# Patient Record
Sex: Female | Born: 1960 | Race: White | Hispanic: No | Marital: Married | Smoking: Never smoker
Health system: Southern US, Community
[De-identification: ages and names within clinical notes are randomized; demographics above are authoritative.]

## PROBLEM LIST (undated history)

## (undated) DIAGNOSIS — B269 Mumps without complication: Secondary | ICD-10-CM

## (undated) DIAGNOSIS — I639 Cerebral infarction, unspecified: Secondary | ICD-10-CM

## (undated) DIAGNOSIS — T7840XA Allergy, unspecified, initial encounter: Secondary | ICD-10-CM

## (undated) DIAGNOSIS — Z0000 Encounter for general adult medical examination without abnormal findings: Secondary | ICD-10-CM

## (undated) DIAGNOSIS — M25562 Pain in left knee: Secondary | ICD-10-CM

## (undated) DIAGNOSIS — R229 Localized swelling, mass and lump, unspecified: Secondary | ICD-10-CM

## (undated) DIAGNOSIS — Z780 Asymptomatic menopausal state: Secondary | ICD-10-CM

## (undated) DIAGNOSIS — B059 Measles without complication: Secondary | ICD-10-CM

## (undated) DIAGNOSIS — B019 Varicella without complication: Secondary | ICD-10-CM

## (undated) DIAGNOSIS — C499 Malignant neoplasm of connective and soft tissue, unspecified: Secondary | ICD-10-CM

## (undated) DIAGNOSIS — G43909 Migraine, unspecified, not intractable, without status migrainosus: Secondary | ICD-10-CM

## (undated) DIAGNOSIS — M5136 Other intervertebral disc degeneration, lumbar region: Secondary | ICD-10-CM

## (undated) HISTORY — DX: Other intervertebral disc degeneration, lumbar region: M51.36

## (undated) HISTORY — DX: Localized swelling, mass and lump, unspecified: R22.9

## (undated) HISTORY — DX: Mumps without complication: B26.9

## (undated) HISTORY — DX: Malignant neoplasm of connective and soft tissue, unspecified: C49.9

## (undated) HISTORY — DX: Pain in left knee: M25.562

## (undated) HISTORY — DX: Encounter for general adult medical examination without abnormal findings: Z00.00

## (undated) HISTORY — DX: Cerebral infarction, unspecified: I63.9

## (undated) HISTORY — DX: Asymptomatic menopausal state: Z78.0

## (undated) HISTORY — DX: Varicella without complication: B01.9

## (undated) HISTORY — DX: Measles without complication: B05.9

## (undated) HISTORY — DX: Allergy, unspecified, initial encounter: T78.40XA

## (undated) HISTORY — DX: Migraine, unspecified, not intractable, without status migrainosus: G43.909

---
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
## 1984-12-04 DIAGNOSIS — I639 Cerebral infarction, unspecified: Secondary | ICD-10-CM

## 1984-12-04 HISTORY — DX: Cerebral infarction, unspecified: I63.9

## 2000-12-04 HISTORY — PX: ABDOMINAL HYSTERECTOMY: SHX81

## 2003-12-05 HISTORY — PX: OTHER SURGICAL HISTORY: SHX169

## 2013-08-18 ENCOUNTER — Telehealth: Payer: Self-pay | Admitting: Family Medicine

## 2013-08-18 NOTE — Telephone Encounter (Signed)
Received medical records from Dr. Renaldo Harrison  [phone]

## 2013-08-22 ENCOUNTER — Ambulatory Visit (INDEPENDENT_AMBULATORY_CARE_PROVIDER_SITE_OTHER): Payer: BC Managed Care – PPO | Admitting: Family Medicine

## 2013-08-22 ENCOUNTER — Encounter: Payer: Self-pay | Admitting: Family Medicine

## 2013-08-22 VITALS — BP 118/72 | HR 65 | Temp 98.8°F | Ht 65.0 in | Wt 143.1 lb

## 2013-08-22 DIAGNOSIS — T7840XA Allergy, unspecified, initial encounter: Secondary | ICD-10-CM

## 2013-08-22 DIAGNOSIS — Z0000 Encounter for general adult medical examination without abnormal findings: Secondary | ICD-10-CM

## 2013-08-22 DIAGNOSIS — M25562 Pain in left knee: Secondary | ICD-10-CM

## 2013-08-22 DIAGNOSIS — Z780 Asymptomatic menopausal state: Secondary | ICD-10-CM

## 2013-08-22 DIAGNOSIS — I639 Cerebral infarction, unspecified: Secondary | ICD-10-CM | POA: Insufficient documentation

## 2013-08-22 DIAGNOSIS — G8929 Other chronic pain: Secondary | ICD-10-CM

## 2013-08-22 DIAGNOSIS — L659 Nonscarring hair loss, unspecified: Secondary | ICD-10-CM

## 2013-08-22 DIAGNOSIS — M25569 Pain in unspecified knee: Secondary | ICD-10-CM

## 2013-08-22 DIAGNOSIS — Z1211 Encounter for screening for malignant neoplasm of colon: Secondary | ICD-10-CM

## 2013-08-22 DIAGNOSIS — G43909 Migraine, unspecified, not intractable, without status migrainosus: Secondary | ICD-10-CM

## 2013-08-22 LAB — RENAL FUNCTION PANEL
CO2: 27 mEq/L (ref 19–32)
Calcium: 9.7 mg/dL (ref 8.4–10.5)
Glucose, Bld: 80 mg/dL (ref 70–99)
Potassium: 4.5 mEq/L (ref 3.5–5.3)
Sodium: 135 mEq/L (ref 135–145)

## 2013-08-22 LAB — LIPID PANEL
HDL: 81 mg/dL (ref >39)
LDL Cholesterol: 114 mg/dL — ABNORMAL HIGH (ref 0–99)
Total CHOL/HDL Ratio: 2.5 Ratio
Triglycerides: 48 mg/dL (ref <150)
VLDL: 10 mg/dL (ref 0–40)

## 2013-08-22 LAB — HEPATIC FUNCTION PANEL
ALT: 12 U/L (ref 0–35)
Bilirubin, Direct: 0.1 mg/dL (ref 0.0–0.3)
Indirect Bilirubin: 0.3 mg/dL (ref 0.0–0.9)
Total Bilirubin: 0.4 mg/dL (ref 0.3–1.2)

## 2013-08-22 LAB — CBC
HCT: 38.7 % (ref 36.0–46.0)
MCV: 88.4 fL (ref 78.0–100.0)
Platelets: 317 10*3/uL (ref 150–400)
RBC: 4.38 MIL/uL (ref 3.87–5.11)
WBC: 6.1 10*3/uL (ref 4.0–10.5)

## 2013-08-22 MED ORDER — FEXOFENADINE HCL 180 MG PO TABS: 180.0000 mg | ORAL_TABLET | Freq: Every day | ORAL | Status: DC

## 2013-08-22 NOTE — Patient Instructions (Addendum)
Mega[employer] caps krill oil daily Digestive [employer]vantage daily  Preventive Care for Adults, Female A healthy lifestyle and preventive care can promote health and wellness. Preventive health guidelines for women include the following key practices.  A routine yearly physical is a good way to check with your caregiver about your health and preventive screening. It is a chance to share any concerns and updates on your health, and to receive a thorough exam.  Visit your dentist for a routine exam and preventive care every 6 months. Brush your teeth twice a day and floss once a day. Good oral hygiene prevents tooth decay and gum disease.  The frequency of eye exams is based on your age, health, family medical history, use of contact lenses, and other factors. Follow your caregiver's recommendations for frequency of eye exams.  Eat a healthy diet. Foods like vegetables, fruits, whole grains, low-fat dairy products, and lean protein foods contain the nutrients you need without too many calories. Decrease your intake of foods high in solid fats, added sugars, and salt. Eat the right amount of calories for you.Get information about a proper diet from your caregiver, if necessary.  Regular physical exercise is one of the most important things you can do for your health. Most adults should get at least 150 minutes of moderate-intensity exercise (any activity that increases your heart rate and causes you to sweat) each week. In addition, most adults need muscle-strengthening exercises on 2 or more days a week.  Maintain a healthy weight. The body mass index (BMI) is a screening tool to identify possible weight problems. It provides an estimate of body fat based on height and weight. Your caregiver can help determine your BMI, and can help you achieve or maintain a healthy weight.For adults 20 years and older:  A BMI below 18.5 is considered underweight.  A BMI of 18.5 to 24.9 is normal.  A BMI of 25 to 29.9 is  considered overweight.  A BMI of 30 and above is considered obese.  Maintain normal blood lipids and cholesterol levels by exercising and minimizing your intake of saturated fat. Eat a balanced diet with plenty of fruit and vegetables. Blood tests for lipids and cholesterol should begin at age 62 and be repeated every 5 years. If your lipid or cholesterol levels are high, you are over 50, or you are at high risk for heart disease, you may need your cholesterol levels checked more frequently.Ongoing high lipid and cholesterol levels should be treated with medicines if diet and exercise are not effective.  If you smoke, find out from your caregiver how to quit. If you do not use tobacco, do not start.  If you are pregnant, do not drink alcohol. If you are breastfeeding, be very cautious about drinking alcohol. If you are not pregnant and choose to drink alcohol, do not exceed 1 drink per day. One drink is considered to be 12 ounces (355 mL) of beer, 5 ounces (148 mL) of wine, or 1.5 ounces (44 mL) of liquor.  Avoid use of street drugs. Do not share needles with anyone. Ask for help if you need support or instructions about stopping the use of drugs.  High blood pressure causes heart disease and increases the risk of stroke. Your blood pressure should be checked at least every 1 to 2 years. Ongoing high blood pressure should be treated with medicines if weight loss and exercise are not effective.  If you are 39 to 52 years old, ask your caregiver if  you should take aspirin to prevent strokes.  Diabetes screening involves taking a blood sample to check your fasting blood sugar level. This should be done once every 3 years, after age 57, if you are within normal weight and without risk factors for diabetes. Testing should be considered at a younger age or be carried out more frequently if you are overweight and have at least 1 risk factor for diabetes.  Breast cancer screening is essential preventive  care for women. You should practice "breast self-awareness." This means understanding the normal appearance and feel of your breasts and may include breast self-examination. Any changes detected, no matter how small, should be reported to a caregiver. Women in their 54s and 30s should have a clinical breast exam (CBE) by a caregiver as part of a regular health exam every 1 to 3 years. After age 13, women should have a CBE every year. Starting at age 72, women should consider having a mammography (breast X-ray test) every year. Women who have a family history of breast cancer should talk to their caregiver about genetic screening. Women at a high risk of breast cancer should talk to their caregivers about having magnetic resonance imaging (MRI) and a mammography every year.  The Pap test is a screening test for cervical cancer. A Pap test can show cell changes on the cervix that might become cervical cancer if left untreated. A Pap test is a procedure in which cells are obtained and examined from the lower end of the uterus (cervix).  Women should have a Pap test starting at age 65.  Between ages 64 and 48, Pap tests should be repeated every 2 years.  Beginning at age 30, you should have a Pap test every 3 years as long as the past 3 Pap tests have been normal.  Some women have medical problems that increase the chance of getting cervical cancer. Talk to your caregiver about these problems. It is especially important to talk to your caregiver if a new problem develops soon after your last Pap test. In these cases, your caregiver may recommend more frequent screening and Pap tests.  The above recommendations are the same for women who have or have not gotten the vaccine for human papillomavirus (HPV).  If you had a hysterectomy for a problem that was not cancer or a condition that could lead to cancer, then you no longer need Pap tests. Even if you no longer need a Pap test, a regular exam is a good idea  to make sure no other problems are starting.  If you are between ages 83 and 29, and you have had normal Pap tests going back 10 years, you no longer need Pap tests. Even if you no longer need a Pap test, a regular exam is a good idea to make sure no other problems are starting.  If you have had past treatment for cervical cancer or a condition that could lead to cancer, you need Pap tests and screening for cancer for at least 20 years after your treatment.  If Pap tests have been discontinued, risk factors (such as a new sexual partner) need to be reassessed to determine if screening should be resumed.  The HPV test is an additional test that may be used for cervical cancer screening. The HPV test looks for the virus that can cause the cell changes on the cervix. The cells collected during the Pap test can be tested for HPV. The HPV test could be used to  screen women aged 56 years and older, and should be used in women of any age who have unclear Pap test results. After the age of 70, women should have HPV testing at the same frequency as a Pap test.  Colorectal cancer can be detected and often prevented. Most routine colorectal cancer screening begins at the age of 99 and continues through age 75. However, your caregiver may recommend screening at an earlier age if you have risk factors for colon cancer. On a yearly basis, your caregiver may provide home test kits to check for hidden blood in the stool. Use of a small camera at the end of a tube, to directly examine the colon (sigmoidoscopy or colonoscopy), can detect the earliest forms of colorectal cancer. Talk to your caregiver about this at age 15, when routine screening begins. Direct examination of the colon should be repeated every 5 to 10 years through age 54, unless early forms of pre-cancerous polyps or small growths are found.  Hepatitis C blood testing is recommended for all people born from [birth year] through 1965 and any individual with known  risks for hepatitis C.  Practice safe sex. Use condoms and avoid high-risk sexual practices to reduce the spread of sexually transmitted infections (STIs). STIs include gonorrhea, chlamydia, syphilis, trichomonas, herpes, HPV, and human immunodeficiency virus (HIV). Herpes, HIV, and HPV are viral illnesses that have no cure. They can result in disability, cancer, and death. Sexually active women aged 59 and younger should be checked for chlamydia. Older women with new or multiple partners should also be tested for chlamydia. Testing for other STIs is recommended if you are sexually active and at increased risk.  Osteoporosis is a disease in which the bones lose minerals and strength with aging. This can result in serious bone fractures. The risk of osteoporosis can be identified using a bone density scan. Women ages 36 and over and women at risk for fractures or osteoporosis should discuss screening with their caregivers. Ask your caregiver whether you should take a calcium supplement or vitamin D to reduce the rate of osteoporosis.  Menopause can be associated with physical symptoms and risks. Hormone replacement therapy is available to decrease symptoms and risks. You should talk to your caregiver about whether hormone replacement therapy is right for you.  Use sunscreen with sun protection factor (SPF) of 30 or more. Apply sunscreen liberally and repeatedly throughout the day. You should seek shade when your shadow is shorter than you. Protect yourself by wearing long sleeves, pants, a wide-brimmed hat, and sunglasses year round, whenever you are outdoors.  Once a month, do a whole body skin exam, using a mirror to look at the skin on your back. Notify your caregiver of new moles, moles that have irregular borders, moles that are larger than a pencil eraser, or moles that have changed in shape or color.  Stay current with required immunizations.  Influenza. You need a dose every fall (or winter).  The composition of the flu vaccine changes each year, so being vaccinated once is not enough.  Pneumococcal polysaccharide. You need 1 to 2 doses if you smoke cigarettes or if you have certain chronic medical conditions. You need 1 dose at age 29 (or older) if you have never been vaccinated.  Tetanus, diphtheria, pertussis (Tdap, Td). Get 1 dose of Tdap vaccine if you are younger than age 28, are over 18 and have contact with an infant, are a Research scientist (physical sciences), are pregnant, or simply want to be protected  from whooping cough. After that, you need a Td booster dose every 10 years. Consult your caregiver if you have not had at least 3 tetanus and diphtheria-containing shots sometime in your life or have a deep or dirty wound.  HPV. You need this vaccine if you are a woman age 52 or younger. The vaccine is given in 3 doses over 6 months.  Measles, mumps, rubella (MMR). You need at least 1 dose of MMR if you were born in 1957 or later. You may also need a second dose.  Meningococcal. If you are age 70 to 2 and a first-year college student living in a residence hall, or have one of several medical conditions, you need to get vaccinated against meningococcal disease. You may also need additional booster doses.  Zoster (shingles). If you are age 81 or older, you should get this vaccine.  Varicella (chickenpox). If you have never had chickenpox or you were vaccinated but received only 1 dose, talk to your caregiver to find out if you need this vaccine.  Hepatitis A. You need this vaccine if you have a specific risk factor for hepatitis A virus infection or you simply wish to be protected from this disease. The vaccine is usually given as 2 doses, 6 to 18 months apart.  Hepatitis B. You need this vaccine if you have a specific risk factor for hepatitis B virus infection or you simply wish to be protected from this disease. The vaccine is given in 3 doses, usually over 6 months. Preventive Services /  Frequency Ages 42 to 67  Blood pressure check.** / Every 1 to 2 years.  Lipid and cholesterol check.** / Every 5 years beginning at age 44.  Clinical breast exam.** / Every 3 years for women in their 71s and 30s.  Pap test.** / Every 2 years from ages 34 through 40. Every 3 years starting at age 2 through age 25 or 55 with a history of 3 consecutive normal Pap tests.  HPV screening.** / Every 3 years from ages 14 through ages 39 to 35 with a history of 3 consecutive normal Pap tests.  Hepatitis C blood test.** / For any individual with known risks for hepatitis C.  Skin self-exam. / Monthly.  Influenza immunization.** / Every year.  Pneumococcal polysaccharide immunization.** / 1 to 2 doses if you smoke cigarettes or if you have certain chronic medical conditions.  Tetanus, diphtheria, pertussis (Tdap, Td) immunization. / A one-time dose of Tdap vaccine. After that, you need a Td booster dose every 10 years.  HPV immunization. / 3 doses over 6 months, if you are 70 and younger.  Measles, mumps, rubella (MMR) immunization. / You need at least 1 dose of MMR if you were born in 1957 or later. You may also need a second dose.  Meningococcal immunization. / 1 dose if you are age 27 to 60 and a first-year college student living in a residence hall, or have one of several medical conditions, you need to get vaccinated against meningococcal disease. You may also need additional booster doses.  Varicella immunization.** / Consult your caregiver.  Hepatitis A immunization.** / Consult your caregiver. 2 doses, 6 to 18 months apart.  Hepatitis B immunization.** / Consult your caregiver. 3 doses usually over 6 months. Ages 90 to 73  Blood pressure check.** / Every 1 to 2 years.  Lipid and cholesterol check.** / Every 5 years beginning at age 40.  Clinical breast exam.** / Every year after age 45.  Mammogram.** / Every year beginning at age 80 and continuing for as long as you are in  good health. Consult with your caregiver.  Pap test.** / Every 3 years starting at age 79 through age 28 or 5 with a history of 3 consecutive normal Pap tests.  HPV screening.** / Every 3 years from ages 9 through ages 33 to 61 with a history of 3 consecutive normal Pap tests.  Fecal occult blood test (FOBT) of stool. / Every year beginning at age 24 and continuing until age 61. You may not need to do this test if you get a colonoscopy every 10 years.  Flexible sigmoidoscopy or colonoscopy.** / Every 5 years for a flexible sigmoidoscopy or every 10 years for a colonoscopy beginning at age 36 and continuing until age 90.  Hepatitis C blood test.** / For all people born from [birth year] through 1965 and any individual with known risks for hepatitis C.  Skin self-exam. / Monthly.  Influenza immunization.** / Every year.  Pneumococcal polysaccharide immunization.** / 1 to 2 doses if you smoke cigarettes or if you have certain chronic medical conditions.  Tetanus, diphtheria, pertussis (Tdap, Td) immunization.** / A one-time dose of Tdap vaccine. After that, you need a Td booster dose every 10 years.  Measles, mumps, rubella (MMR) immunization. / You need at least 1 dose of MMR if you were born in 1957 or later. You may also need a second dose.  Varicella immunization.** / Consult your caregiver.  Meningococcal immunization.** / Consult your caregiver.  Hepatitis A immunization.** / Consult your caregiver. 2 doses, 6 to 18 months apart.  Hepatitis B immunization.** / Consult your caregiver. 3 doses, usually over 6 months. Ages 19 and over  Blood pressure check.** / Every 1 to 2 years.  Lipid and cholesterol check.** / Every 5 years beginning at age 66.  Clinical breast exam.** / Every year after age 53.  Mammogram.** / Every year beginning at age 28 and continuing for as long as you are in good health. Consult with your caregiver.  Pap test.** / Every 3 years starting at age 18 through  age 12 or 18 with a 3 consecutive normal Pap tests. Testing can be stopped between 65 and 70 with 3 consecutive normal Pap tests and no abnormal Pap or HPV tests in the past 10 years.  HPV screening.** / Every 3 years from ages 51 through ages 38 or 39 with a history of 3 consecutive normal Pap tests. Testing can be stopped between 65 and 70 with 3 consecutive normal Pap tests and no abnormal Pap or HPV tests in the past 10 years.  Fecal occult blood test (FOBT) of stool. / Every year beginning at age 64 and continuing until age 43. You may not need to do this test if you get a colonoscopy every 10 years.  Flexible sigmoidoscopy or colonoscopy.** / Every 5 years for a flexible sigmoidoscopy or every 10 years for a colonoscopy beginning at age 55 and continuing until age 62.  Hepatitis C blood test.** / For all people born from [birth year] through 1965 and any individual with known risks for hepatitis C.  Osteoporosis screening.** / A one-time screening for women ages 60 and over and women at risk for fractures or osteoporosis.  Skin self-exam. / Monthly.  Influenza immunization.** / Every year.  Pneumococcal polysaccharide immunization.** / 1 dose at age 25 (or older) if you have never been vaccinated.  Tetanus, diphtheria, pertussis (Tdap, Td) immunization. / A one-time  dose of Tdap vaccine if you are over 65 and have contact with an infant, are a Research scientist (physical sciences), or simply want to be protected from whooping cough. After that, you need a Td booster dose every 10 years.  Varicella immunization.** / Consult your caregiver.  Meningococcal immunization.** / Consult your caregiver.  Hepatitis A immunization.** / Consult your caregiver. 2 doses, 6 to 18 months apart.  Hepatitis B immunization.** / Check with your caregiver. 3 doses, usually over 6 months. ** Family history and personal history of risk and conditions may change your caregiver's recommendations. Document Released: 01/16/2002 Document  Revised: 02/12/2012 Document Reviewed: 04/17/2011 [facility] Patient Information 2014 [place], [place].

## 2013-08-24 ENCOUNTER — Encounter: Payer: Self-pay | Admitting: Family Medicine

## 2013-08-24 DIAGNOSIS — M25562 Pain in left knee: Secondary | ICD-10-CM

## 2013-08-24 DIAGNOSIS — Z780 Asymptomatic menopausal state: Secondary | ICD-10-CM

## 2013-08-24 DIAGNOSIS — Z0000 Encounter for general adult medical examination without abnormal findings: Secondary | ICD-10-CM

## 2013-08-24 HISTORY — DX: Pain in left knee: M25.562

## 2013-08-24 HISTORY — DX: Asymptomatic menopausal state: Z78.0

## 2013-08-24 HISTORY — DX: Encounter for general adult medical examination without abnormal findings: Z00.00

## 2013-08-24 NOTE — Assessment & Plan Note (Signed)
Good response to Allegra, continue the same.

## 2013-08-24 NOTE — Assessment & Plan Note (Addendum)
Bone densitometry ordered today. Will proceed with 3D MGM at same time

## 2013-08-24 NOTE — Assessment & Plan Note (Signed)
Has had trouble with this knee for years but it is worsening and becoming more swelling. Is referred to orthopaedics for further consideration at this time.

## 2013-08-24 NOTE — Progress Notes (Signed)
Patient ID: Krista Hampton, female   DOB: 14-Mar-1961, 52 y.o.   MRN: [id number] Krista Hampton [id number] Dec 08, 1960 08/24/2013      Progress Note New Patient  Subjective  Chief Complaint  Chief Complaint  Patient presents with  . Establish Care    new patient    HPI  Patient is a 52 year old Caucasian female who is recently relocated to the area. His immediate ongoing primary care. Feels well today. No recent illness. Her largest complaint is of left knee pain. She's had swelling and discomfort in her lateral left knee for years but is worsening recently. Denies any trauma, redness or warmth. He is struggling with numerous low grade concern such as poor sleep, awakened easily. Also notes anxiety with the move and some recent hair loss. He is questioning whether she could have some thyroid dysfunction as her sister has recently been diagnosed with thyroid disease. She has set herself up with a GYN appointment but it is not October. No recent chest pain, fevers, palpitations, shortness of breath, GI or GU complaints.  Past Medical History  Diagnosis Date  . 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 Chicken pox as a child  . Measles as a child  . Mumps as a child  . Allergy     hay fever  . Migraines   . Post-menopausal 08/24/2013  . Preventative health care 08/24/2013  . Left knee pain 08/24/2013  . Allergic state     Past Surgical History  Procedure Laterality Date  . Abdominal hysterectomy  2002    partial- still has left ovary  . Right ovary  2005    removed  . Tonsillectomy  1979  . Wisdom tooth extraction  52 yrs old    Family History  Problem Relation Age of Onset  . Lupus Mother   . Diabetes Mother     type 2  . Hypertension Mother   . Heart disease Father     open heart surgery  . Hyperlipidemia Father   . Heart attack Father     several  . Diabetes Sister     type 2  . Heart disease Sister   . Diabetes Sister     type 2  . Hypertension Sister   . Thyroid  disease Sister   . Kidney disease Sister   . Heart disease Brother   . Hypertension Brother   . Diabetes Maternal Grandmother     type 2  . Heart attack Paternal Grandfather 36    massive    History   Social History  . Marital Status: Married    Spouse Name: N/A    Number of Children: N/A  . Years of Education: N/A   Occupational History  . Not on file.   Social History Main Topics  . Smoking status: Never Smoker   . Smokeless tobacco: Not on file  . Alcohol Use: Yes     Comment: occasional drik  . Drug Use: No  . Sexual Activity: Yes   Other Topics Concern  . Not on file   Social History Narrative  . No narrative on file    No current outpatient prescriptions on file prior to visit.   No current facility-administered medications on file prior to visit.    Allergies  Allergen Reactions  . Latex   . Sulfa Antibiotics Hives  . Keflex [Cephalexin] Rash  . Penicillins Rash  . Tetracyclines & Related Hives and Rash    Review of Systems  Review of Systems  Constitutional: Negative for fever, chills and malaise/fatigue.  HENT: Negative for hearing loss, nosebleeds and congestion.   Eyes: Negative for discharge.  Respiratory: Negative for cough, sputum production, shortness of breath and wheezing.   Cardiovascular: Negative for chest pain, palpitations and leg swelling.  Gastrointestinal: Negative for heartburn, nausea, vomiting, abdominal pain, diarrhea, constipation and blood in stool.  Genitourinary: Negative for dysuria, urgency, frequency and hematuria.  Musculoskeletal: Positive for joint pain. Negative for myalgias, back pain and falls.  Skin: Negative for rash.  Neurological: Negative for dizziness, tremors, sensory change, focal weakness, loss of consciousness, weakness and headaches.  Endo/Heme/Allergies: Negative for polydipsia. Does not bruise/bleed easily.  Psychiatric/Behavioral: Negative for depression and suicidal ideas. The patient is not  nervous/anxious and does not have insomnia.     Objective  BP 118/72  Pulse 65  Temp(Src) 98.8 F (37.1 C) (Oral)  Ht 5\' 5"  (1.651 m)  Wt 143 lb 1.9 oz (64.919 kg)  BMI 23.82 kg/m2  SpO2 97%  Physical Exam  Physical Exam  Constitutional: She is oriented to person, place, and time and well-developed, well-nourished, and in no distress. No distress.  HENT:  Head: Normocephalic and atraumatic.  Right Ear: External ear normal.  Left Ear: External ear normal.  Nose: Nose normal.  Mouth/Throat: Oropharynx is clear and moist. No oropharyngeal exudate.  Eyes: Conjunctivae are normal. Pupils are equal, round, and reactive to light. Right eye exhibits no discharge. Left eye exhibits no discharge. No scleral icterus.  Neck: Normal range of motion. Neck supple. No thyromegaly present.  Cardiovascular: Normal rate, regular rhythm, normal heart sounds and intact distal pulses.   No murmur heard. Pulmonary/Chest: Effort normal and breath sounds normal. No respiratory distress. She has no wheezes. She has no rales.  Abdominal: Soft. Bowel sounds are normal. She exhibits no distension and no mass. There is no tenderness.  Musculoskeletal: Normal range of motion. She exhibits edema. She exhibits no tenderness.  Swelling caudal and lateral to left knee.   Lymphadenopathy:    She has no cervical adenopathy.  Neurological: She is alert and oriented to person, place, and time. She has normal reflexes. No cranial nerve deficit. Coordination normal.  Skin: Skin is warm and dry. No rash noted. She is not diaphoretic.  Psychiatric: Mood, memory and affect normal.       Assessment & Plan  Left knee pain Has had trouble with this knee for years but it is worsening and becoming more swelling. Is referred to orthopaedics for further consideration at this time.  Allergic state Good response to Allegra, continue the same.   Post-menopausal Bone densitometry ordered today.   Migraines No recent  trouble, had them prior to her hysterectomy  Preventative health care Declines flu shot today, fasting labs today, encouraged heart healthy diet, probiotics daily, regular sleep. Continue exercise.

## 2013-08-24 NOTE — Assessment & Plan Note (Signed)
No recent trouble, had them prior to her hysterectomy

## 2013-08-24 NOTE — Assessment & Plan Note (Signed)
Declines flu shot today, fasting labs today, encouraged heart healthy diet, probiotics daily, regular sleep. Continue exercise.

## 2013-08-25 ENCOUNTER — Other Ambulatory Visit: Payer: Self-pay | Admitting: Family Medicine

## 2013-08-25 DIAGNOSIS — Z1231 Encounter for screening mammogram for malignant neoplasm of breast: Secondary | ICD-10-CM

## 2013-09-15 ENCOUNTER — Encounter: Payer: Self-pay | Admitting: Gastroenterology

## 2013-09-30 ENCOUNTER — Ambulatory Visit
Admission: RE | Admit: 2013-09-30 | Discharge: 2013-09-30 | Disposition: A | Discharge status: Home / Self Care | Payer: BC Managed Care – PPO | Source: Ambulatory Visit | Attending: Family Medicine | Admitting: Family Medicine

## 2013-09-30 DIAGNOSIS — Z780 Asymptomatic menopausal state: Secondary | ICD-10-CM

## 2013-09-30 DIAGNOSIS — Z1231 Encounter for screening mammogram for malignant neoplasm of breast: Secondary | ICD-10-CM

## 2013-10-07 ENCOUNTER — Ambulatory Visit: Payer: BC Managed Care – PPO

## 2013-10-07 ENCOUNTER — Other Ambulatory Visit: Payer: BC Managed Care – PPO

## 2013-10-09 ENCOUNTER — Other Ambulatory Visit: Payer: Self-pay

## 2013-11-17 ENCOUNTER — Other Ambulatory Visit: Payer: BC Managed Care – PPO | Admitting: Gastroenterology

## 2014-01-13 DIAGNOSIS — C499 Malignant neoplasm of connective and soft tissue, unspecified: Secondary | ICD-10-CM

## 2014-01-13 HISTORY — DX: Malignant neoplasm of connective and soft tissue, unspecified: C49.9

## 2014-01-23 HISTORY — PX: OTHER SURGICAL HISTORY: SHX169

## 2014-04-28 ENCOUNTER — Ambulatory Visit (INDEPENDENT_AMBULATORY_CARE_PROVIDER_SITE_OTHER): Payer: BC Managed Care – PPO | Admitting: Family Medicine

## 2014-04-28 ENCOUNTER — Encounter: Payer: Self-pay | Admitting: Family Medicine

## 2014-04-28 VITALS — BP 122/78 | HR 58 | Temp 97.9°F | Ht 65.0 in | Wt 139.0 lb

## 2014-04-28 DIAGNOSIS — C499 Malignant neoplasm of connective and soft tissue, unspecified: Secondary | ICD-10-CM

## 2014-04-28 DIAGNOSIS — R229 Localized swelling, mass and lump, unspecified: Secondary | ICD-10-CM

## 2014-04-28 DIAGNOSIS — Z1211 Encounter for screening for malignant neoplasm of colon: Secondary | ICD-10-CM

## 2014-04-28 NOTE — Progress Notes (Signed)
Pre visit review using our clinic review tool, if applicable. No additional management support is needed unless otherwise documented below in the visit note. 

## 2014-04-28 NOTE — Patient Instructions (Signed)

## 2014-05-01 ENCOUNTER — Encounter: Payer: Self-pay | Admitting: Gastroenterology

## 2014-05-03 ENCOUNTER — Encounter: Payer: Self-pay | Admitting: Family Medicine

## 2014-05-03 DIAGNOSIS — Z1211 Encounter for screening for malignant neoplasm of colon: Secondary | ICD-10-CM | POA: Insufficient documentation

## 2014-05-03 DIAGNOSIS — R229 Localized swelling, mass and lump, unspecified: Secondary | ICD-10-CM

## 2014-05-03 HISTORY — DX: Localized swelling, mass and lump, unspecified: R22.9

## 2014-05-03 NOTE — Assessment & Plan Note (Signed)
Right middle back, has been present for many years. Unchanged but is scheduled for imaging this week due to recent history

## 2014-05-03 NOTE — Assessment & Plan Note (Signed)
Left leg, has done well, tolerated radiation. Has been following at [facility] with Dr Andris Flurry of oncology.

## 2014-05-03 NOTE — Assessment & Plan Note (Signed)
Agrees to referral for colonoscopy today

## 2014-05-03 NOTE — Progress Notes (Signed)
Patient ID: Krista Hampton, female   DOB: 1961-11-02, 53 y.o.   MRN: [id number] Krista Hampton [id number] 07-28-1961 05/03/2014      Progress Note-Follow Up  Subjective  Chief Complaint  Chief Complaint  Patient presents with  . Back Problem    spot on back needs to be looked at    HPI  Patient is a 53 year old female in today for routine medical care. She is in today to discuss a lesion on her right flank. She's had for many years she thinks maybe 30 but given her recent history of leiomyosarcoma she is scheduled to have a CT of this area done tomorrow. She is here today for reassurance. Overall she feels well. No recent illness or concerns otherwise. Denies CP/palp/SOB/HA/congestion/fevers/GI or GU c/o. Taking meds as prescribed  Past Medical History  Diagnosis Date  . 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 Chicken pox as a child  . Measles as a child  . Mumps as a child  . Allergy     hay fever  . Migraines   . Post-menopausal 08/24/2013  . Preventative health care 08/24/2013  . Left knee pain 08/24/2013  . Allergic state   . Mass of skin 05/03/2014  . Sarcoma 01/13/2014    Past Surgical History  Procedure Laterality Date  . Abdominal hysterectomy  2002    partial- still has left ovary  . Right ovary  2005    removed  . Tonsillectomy  1979  . Wisdom tooth extraction  53 yrs old    Family History  Problem Relation Age of Onset  . Lupus Mother   . Diabetes Mother     type 2  . Hypertension Mother   . Heart disease Father     open heart surgery  . Hyperlipidemia Father   . Heart attack Father     several  . Diabetes Sister     type 2  . Heart disease Sister   . Diabetes Sister     type 2  . Hypertension Sister   . Thyroid disease Sister   . Kidney disease Sister   . Heart disease Brother   . Hypertension Brother   . Diabetes Maternal Grandmother     type 2  . Heart attack Paternal Grandfather 58    massive    History   Social History  . Marital Status:  Married    Spouse Name: N/A    Number of Children: N/A  . Years of Education: N/A   Occupational History  . Not on file.   Social History Main Topics  . Smoking status: Never Smoker   . Smokeless tobacco: Not on file  . Alcohol Use: Yes     Comment: occasional drik  . Drug Use: No  . Sexual Activity: Yes   Other Topics Concern  . Not on file   Social History Narrative  . No narrative on file    No current outpatient prescriptions on file prior to visit.   No current facility-administered medications on file prior to visit.    Allergies  Allergen Reactions  . Latex   . Sulfa Antibiotics Hives  . Keflex [Cephalexin] Rash  . Penicillins Rash  . Tetracyclines & Related Hives and Rash    Review of Systems  Review of Systems  Constitutional: Negative for fever and malaise/fatigue.  HENT: Negative for congestion.   Eyes: Negative for discharge.  Respiratory: Negative for shortness of breath.   Cardiovascular: Negative  for chest pain, palpitations and leg swelling.  Gastrointestinal: Negative for nausea, abdominal pain and diarrhea.  Genitourinary: Negative for dysuria.  Musculoskeletal: Negative for falls.  Skin: Negative for rash.  Neurological: Negative for loss of consciousness and headaches.  Endo/Heme/Allergies: Negative for polydipsia.  Psychiatric/Behavioral: Negative for depression and suicidal ideas. The patient is not nervous/anxious and does not have insomnia.     Objective  BP 122/78  Pulse 58  Temp(Src) 97.9 F (36.6 C) (Oral)  Ht 5\' 5"  (1.651 m)  Wt 139 lb 0.6 oz (63.068 kg)  BMI 23.14 kg/m2  SpO2 98%  Physical Exam  Physical Exam  Constitutional: She is oriented to person, place, and time and well-developed, well-nourished, and in no distress. No distress.  HENT:  Head: Normocephalic and atraumatic.  Eyes: Conjunctivae are normal.  Neck: Neck supple. No thyromegaly present.  Cardiovascular: Normal rate, regular rhythm and normal heart  sounds.   No murmur heard. Pulmonary/Chest: Effort normal and breath sounds normal. She has no wheezes.  Abdominal: She exhibits no distension and no mass.  Musculoskeletal: She exhibits no edema.  Lymphadenopathy:    She has no cervical adenopathy.  Neurological: She is alert and oriented to person, place, and time.  Skin: Skin is warm and dry. No rash noted. She is not diaphoretic.  2 cm circular, mobile lesion right flank/back  Psychiatric: Memory, affect and judgment normal.    Lab Results  Component Value Date   TSH 1.059 08/22/2013   Lab Results  Component Value Date   WBC 6.1 08/22/2013   HGB 13.3 08/22/2013   HCT 38.7 08/22/2013   MCV 88.4 08/22/2013   PLT 317 08/22/2013   Lab Results  Component Value Date   CREATININE 0.73 08/22/2013   BUN 31* 08/22/2013   NA 135 08/22/2013   K 4.5 08/22/2013   CL 102 08/22/2013   CO2 27 08/22/2013   Lab Results  Component Value Date   ALT 12 08/22/2013   AST 15 08/22/2013   ALKPHOS 59 08/22/2013   BILITOT 0.4 08/22/2013   Lab Results  Component Value Date   CHOL 205* 08/22/2013   Lab Results  Component Value Date   HDL 81 08/22/2013   Lab Results  Component Value Date   LDLCALC 114* 08/22/2013   Lab Results  Component Value Date   TRIG 48 08/22/2013   Lab Results  Component Value Date   CHOLHDL 2.5 08/22/2013     Assessment & Plan  Colon cancer screening Agrees to referral for colonoscopy today  Mass of skin Right middle back, has been present for many years. Unchanged but is scheduled for imaging this week due to recent history  Sarcoma Left leg, has done well, tolerated radiation. Has been following at [facility] with Dr Andris Flurry of oncology.

## 2014-06-23 ENCOUNTER — Ambulatory Visit (AMBULATORY_SURGERY_CENTER): Payer: Self-pay | Admitting: *Deleted

## 2014-06-23 VITALS — Ht 65.0 in | Wt 139.6 lb

## 2014-06-23 DIAGNOSIS — Z1211 Encounter for screening for malignant neoplasm of colon: Secondary | ICD-10-CM

## 2014-06-23 MED ORDER — MOVIPREP 100 G PO SOLR: ORAL | Status: DC

## 2014-06-23 NOTE — Progress Notes (Signed)
No allergies to eggs or soy. No problems with anesthesia.  Pt given Emmi instructions for colonoscopy  No oxygen use  No diet drug use  

## 2014-07-07 ENCOUNTER — Encounter: Payer: Self-pay | Admitting: Gastroenterology

## 2014-07-07 ENCOUNTER — Ambulatory Visit (AMBULATORY_SURGERY_CENTER): Payer: BC Managed Care – PPO | Admitting: Gastroenterology

## 2014-07-07 VITALS — BP 108/70 | HR 58 | Temp 97.5°F | Resp 26 | Ht 65.0 in | Wt 139.0 lb

## 2014-07-07 DIAGNOSIS — Z1211 Encounter for screening for malignant neoplasm of colon: Secondary | ICD-10-CM

## 2014-07-07 MED ORDER — SODIUM CHLORIDE 0.9 % IV SOLN
500.0000 mL | INTRAVENOUS | Status: DC
Start: 2014-07-07 — End: 2014-07-07

## 2014-07-07 NOTE — Patient Instructions (Signed)
YOU HAD AN ENDOSCOPIC PROCEDURE TODAY AT THE [facility]: Refer to the procedure report that was given to you for any specific questions about what was found during the examination.  If the procedure report does not answer your questions, please call your gastroenterologist to clarify.  If you requested that your care partner not be given the details of your procedure findings, then the procedure report has been included in a sealed envelope for you to review at your convenience later.  YOU SHOULD EXPECT: Some feelings of bloating in the abdomen. Passage of more gas than usual.  Walking can help get rid of the air that was put into your GI tract during the procedure and reduce the bloating. If you had a lower endoscopy (such as a colonoscopy or flexible sigmoidoscopy) you may notice spotting of blood in your stool or on the toilet paper. If you underwent a bowel prep for your procedure, then you may not have a normal bowel movement for a few days.  DIET: Your first meal following the procedure should be a light meal and then it is ok to progress to your normal diet.  A half-sandwich or bowl of soup is an example of a good first meal.  Heavy or fried foods are harder to digest and may make you feel nauseous or bloated.  Likewise meals heavy in dairy and vegetables can cause extra gas to form and this can also increase the bloating.  Drink plenty of fluids but you should avoid alcoholic beverages for 24 hours.  ACTIVITY: Your care partner should take you home directly after the procedure.  You should plan to take it easy, moving slowly for the rest of the day.  You can resume normal activity the day after the procedure however you should NOT DRIVE or use heavy machinery for 24 hours (because of the sedation medicines used during the test).    SYMPTOMS TO REPORT IMMEDIATELY: A gastroenterologist can be reached at any hour.  During normal business hours, 8:30 AM to 5:00 PM Monday through Friday,  call [phone].  After hours and on weekends, please call the GI answering service at [phone] who will take a message and have the physician on call contact you.   Following lower endoscopy (colonoscopy or flexible sigmoidoscopy):  Excessive amounts of blood in the stool  Significant tenderness or worsening of abdominal pains  Swelling of the abdomen that is new, acute  Fever of 100F or higher    FOLLOW UP: If any biopsies were taken you will be contacted by phone or by letter within the next 1-3 weeks.  Call your gastroenterologist if you have not heard about the biopsies in 3 weeks.  Our staff will call the home number listed on your records the next business day following your procedure to check on you and address any questions or concerns that you may have at that time regarding the information given to you following your procedure. This is a courtesy call and so if there is no answer at the home number and we have not heard from you through the emergency physician on call, we will assume that you have returned to your regular daily activities without incident.  SIGNATURES/CONFIDENTIALITY: You and/or your care partner have signed paperwork which will be entered into your electronic medical record.  These signatures attest to the fact that that the information above on your After Visit Summary has been reviewed and is understood.  Full responsibility of the confidentiality   of this discharge information lies with you and/or your care-partner.     

## 2014-07-07 NOTE — Op Note (Signed)
[place]  [address] [place], [id number]   COLONOSCOPY PROCEDURE REPORT  PATIENT: Krista, Hampton  MR#: [id number] BIRTHDATE: Dec 03, 1961 , 52  yrs. old GENDER: Female ENDOSCOPIST: Ladene Artist, MD, [facility] REFERRED JG:[id number] Charlett Blake, M.D. PROCEDURE DATE:  07/07/2014 PROCEDURE:   Colonoscopy, screening First Screening Colonoscopy - Avg.  risk and is 50 yrs.  old or older Yes.  Prior Negative Screening - Now for repeat screening. N/A  History of Adenoma - Now for follow-up colonoscopy & has been > or = to 3 yrs.  N/A  Polyps Removed Today? No.  Recommend repeat exam, <10 yrs? No. ASA CLASS:   Class II INDICATIONS:average risk screening. MEDICATIONS: MAC sedation, administered by CRNA and propofol (Diprivan) 240mg  IV DESCRIPTION OF PROCEDURE:   After the risks benefits and alternatives of the procedure were thoroughly explained, informed consent was obtained.  A digital rectal exam revealed no abnormalities of the rectum.   The LB [id number]  endoscope was introduced through the anus and advanced to the cecum, which was identified by both the appendix and ileocecal valve. No adverse events experienced.   The quality of the prep was adequate, using MoviPrep  The instrument was then slowly withdrawn as the colon was fully examined.  COLON FINDINGS: A normal appearing cecum, ileocecal valve, and appendiceal orifice were identified.  The ascending, hepatic flexure, transverse, splenic flexure, descending, sigmoid colon and rectum appeared unremarkable.  No polyps or cancers were seen. Retroflexed views revealed no abnormalities. The time to cecum=4 minutes 18 seconds.  Withdrawal time=9 minutes 24 seconds.  The scope was withdrawn and the procedure completed.  COMPLICATIONS: There were no complications.  ENDOSCOPIC IMPRESSION: 1. Normal colon  RECOMMENDATIONS: 1. Continue to follow colorectal cancer screening guidelines for "routine risk" patients with a  repeat colonoscopy in 10 years. There is no need for routine, screening FOBT (stool) testing for at least 5 years.  eSigned:  Ladene Artist, MD, [facility] 07/07/2014 9:20 AM

## 2014-07-07 NOTE — Progress Notes (Signed)
Report to PACU, RN, vss, BBS= Clear.  

## 2014-07-08 ENCOUNTER — Telehealth: Payer: Self-pay | Admitting: *Deleted

## 2014-07-08 NOTE — Telephone Encounter (Signed)
  Follow up Call-  Call back number 07/07/2014  Post procedure Call Back phone  # [phone]  Permission to leave phone message Yes     Patient questions:  Do you have a fever, pain , or abdominal swelling? No. Pain Score  0 *  Have you tolerated food without any problems? Yes.    Have you been able to return to your normal activities? Yes.    Do you have any questions about your discharge instructions: Diet   No. Medications  No. Follow up visit  No.  Do you have questions or concerns about your Care? No.  Actions: * If pain score is 4 or above: No action needed, pain <4.

## 2014-09-04 ENCOUNTER — Encounter: Payer: Self-pay | Admitting: Medical

## 2014-09-04 ENCOUNTER — Ambulatory Visit (INDEPENDENT_AMBULATORY_CARE_PROVIDER_SITE_OTHER): Payer: BC Managed Care – PPO | Admitting: Medical

## 2014-09-04 VITALS — BP 125/80 | HR 65 | Temp 97.7°F | Ht 65.5 in | Wt 142.4 lb

## 2014-09-04 DIAGNOSIS — L739 Follicular disorder, unspecified: Secondary | ICD-10-CM | POA: Insufficient documentation

## 2014-09-04 MED ORDER — CEFUROXIME AXETIL 250 MG PO TABS: 250.0000 mg | ORAL_TABLET | Freq: Two times a day (BID) | ORAL | Status: DC

## 2014-09-04 NOTE — Patient Instructions (Signed)
By exam you appear to have for area on scalp where follicles appear inflamed. Also rt side neck occiptial region lymph node swollen. I will prescribe you ceftin antibiotic otc. If our have reaction to antibiotic stop it and notify us. Use probiotics while on antibiotic.  Follow up in 10-14 days recheck scalp and lymph node. If scalp better and lymph node persists will get cbc. Follow up as needed if area changes or worsens.  Folliculitis  Folliculitis is redness, soreness, and swelling (inflammation) of the hair follicles. This condition can occur anywhere on the body. People with weakened immune systems, diabetes, or obesity have a greater risk of getting folliculitis. CAUSES  Bacterial infection. This is the most common cause.  Fungal infection.  Viral infection.  Contact with certain chemicals, especially oils and tars. Long-term folliculitis can result from bacteria that live in the nostrils. The bacteria may trigger multiple outbreaks of folliculitis over time. SYMPTOMS Folliculitis most commonly occurs on the scalp, thighs, legs, back, buttocks, and areas where hair is shaved frequently. An early sign of folliculitis is a small, white or yellow, pus-filled, itchy lesion (pustule). These lesions appear on a red, inflamed follicle. They are usually less than 0.2 inches (5 mm) wide. When there is an infection of the follicle that goes deeper, it becomes a boil or furuncle. A group of closely packed boils creates a larger lesion (carbuncle). Carbuncles tend to occur in hairy, sweaty areas of the body. DIAGNOSIS  Your caregiver can usually tell what is wrong by doing a physical exam. A sample may be taken from one of the lesions and tested in a lab. This can help determine what is causing your folliculitis. TREATMENT  Treatment may include:  Applying warm compresses to the affected areas.  Taking antibiotic medicines orally or applying them to the skin.  Draining the lesions if they  contain a large amount of pus or fluid.  Laser hair removal for cases of long-lasting folliculitis. This helps to prevent regrowth of the hair. HOME CARE INSTRUCTIONS  Apply warm compresses to the affected areas as directed by your caregiver.  If antibiotics are prescribed, take them as directed. Finish them even if you start to feel better.  You may take over-the-counter medicines to relieve itching.  Do not shave irritated skin.  Follow up with your caregiver as directed. SEEK IMMEDIATE MEDICAL CARE IF:   You have increasing redness, swelling, or pain in the affected area.  You have a fever. MAKE SURE YOU:  Understand these instructions.  Will watch your condition.  Will get help right away if you are not doing well or get worse. Document Released: 01/29/2002 Document Revised: 05/21/2012 Document Reviewed: 02/20/2012 [facility] Patient Information 2015 [place], [place]. This information is not intended to replace advice given to you by your health care provider. Make sure you discuss any questions you have with your health care provider.

## 2014-09-04 NOTE — Assessment & Plan Note (Signed)
Will rx ceftin. Only antibiotic that she has no reaction to. She has very remote reaction cephalexin(rash). She has taken ceftin after taking keflex and had no reaction.

## 2014-09-04 NOTE — Progress Notes (Signed)
Subjective:    Patient ID: [name], female    DOB: 1960-12-12, 53 y.o.   MRN: [id number]  HPI  Pt in with scalp irritated and pt thinks can feel small bumps. Area does itch. No flaking of scalp. No rash on her body. Pt tried t-gel and tricalm cream otc. Never had this before.  Pt states only antibiotic that she never had reaction to was ceclor and ceftin.  Mid summer poison ivy. Had on her body. Treated with topical steroid. Those area resolved. Pt never had any issues with her scalp.  Past Medical History  Diagnosis Date  . 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 Chicken pox as a child  . Measles as a child  . Mumps as a child  . Allergy     hay fever  . Migraines   . Post-menopausal 08/24/2013  . Preventative health care 08/24/2013  . Left knee pain 08/24/2013  . Allergic state   . Mass of skin 05/03/2014  . 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 Marital Status: Married    Spouse Name: N/A    Number of Children: N/A  . Years of Education: N/A   Occupational History  . Not on file.   Social History Main Topics  . Smoking status: Never Smoker   . Smokeless tobacco: Never Used  . Alcohol Use: Yes     Comment: 2 drinks a month  . Drug Use: No  . Sexual Activity: Yes   Other Topics Concern  . Not on file   Social History Narrative  . No narrative on file    Past Surgical History  Procedure Laterality Date  . Abdominal hysterectomy  2002    partial- still has left ovary  . Right ovary  2005    removed  . Tonsillectomy  1979  . Wisdom tooth extraction  1980  . Birth mark removal Left 1967    forearm  . 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 Lupus Mother   . Diabetes Mother     type 2  . Hypertension Mother   . Heart disease Father     open heart surgery  . Hyperlipidemia Father   . Heart attack Father     several  . Diabetes Sister     type 2  . Heart disease Sister   . Diabetes  Sister     type 2  . Hypertension Sister   . Thyroid disease Sister   . Kidney disease Sister   . Heart disease Brother   . Hypertension Brother   . Diabetes Maternal Grandmother     type 2  . Heart attack Paternal Grandfather 42    massive  . Colon cancer Neg Hx   . Stomach cancer Neg Hx   . Rectal cancer Neg Hx     Allergies  Allergen Reactions  . Sulfa Antibiotics Hives  . Clindamycin/Lincomycin Hives and Rash  . Erythromycin Itching and Rash  . Keflex [Cephalexin] Rash  . Latex Rash  . Penicillins Rash  . Tetracyclines & Related Hives and Rash    Current Outpatient Prescriptions on File Prior to Visit  Medication Sig Dispense Refill  . fexofenadine (ALLEGRA) 180 MG tablet Take 180 mg by mouth daily.       No current facility-administered medications on file prior to visit.    BP 125/80  Pulse 65  Temp(Src) 97.7 F (36.5 C) (Oral)  Ht 5' 5.5" (1.664 m)  Wt 142 lb 6.4 oz (64.592 kg)  BMI 23.33 kg/m2  SpO2 99%       Review of Systems  Constitutional: Negative for fever, chills and fatigue.  HENT: Negative.   Respiratory: Negative for cough, chest tightness, shortness of breath and wheezing.   Cardiovascular: Negative for chest pain and palpitations.  Gastrointestinal: Negative.   Genitourinary: Negative.   Musculoskeletal: Negative.   Neurological: Negative for dizziness, seizures, syncope, facial asymmetry, speech difficulty, weakness, light-headedness, numbness and headaches.  Hematological: Positive for adenopathy. Does not bruise/bleed easily.       Rt side back of neck below occipital region moderate large lymph node.But not tender.  Psychiatric/Behavioral: Negative.        Objective:   Physical Exam General- no acute distress.Pleasant Lungs- cta Heart- RRR Heent- Nares clear. No sinus pressure. Posterior pharynx exam normal. Both ear canals clear and tm normal.  Skin/scalp-Thick hair but 4 area where follicles look inflamed scattered and  thoughout.. Mild tender. No flaking of scalp. No dandruff, no lice.  One small lymh node posterior occipatal region swollen.Non tender.        Assessment & Plan:

## 2014-09-21 ENCOUNTER — Ambulatory Visit (INDEPENDENT_AMBULATORY_CARE_PROVIDER_SITE_OTHER): Payer: BC Managed Care – PPO | Admitting: Medical

## 2014-09-21 ENCOUNTER — Encounter: Payer: Self-pay | Admitting: Medical

## 2014-09-21 VITALS — BP 111/70 | HR 55 | Temp 98.1°F | Ht 64.5 in | Wt 140.8 lb

## 2014-09-21 DIAGNOSIS — L739 Follicular disorder, unspecified: Secondary | ICD-10-CM

## 2014-09-21 DIAGNOSIS — J302 Other seasonal allergic rhinitis: Secondary | ICD-10-CM

## 2014-09-21 DIAGNOSIS — J309 Allergic rhinitis, unspecified: Secondary | ICD-10-CM | POA: Insufficient documentation

## 2014-09-21 MED ORDER — FEXOFENADINE HCL 180 MG PO TABS: 180.0000 mg | ORAL_TABLET | Freq: Every day | ORAL | Status: DC

## 2014-09-21 NOTE — Assessment & Plan Note (Signed)
Periodic and occasional. She is running out of allegra rx. So I went ahead and sent rx to pharmacy.

## 2014-09-21 NOTE — Progress Notes (Signed)
Subjective:    Patient ID: Krista Hampton, female    DOB: 05-Jul-1961, 53 y.o.   MRN: [id number]  HPI  Pt in states no reaction to ceftin. Her scalp feels clear and follicles inflamed posterior neck also cleared. Lymph node went down as well rt side of her neck.   No fevers or chills reported.     Pt states almost out of allegra. Uses on prn basis. Occasional has seasonal allergies.  Past Medical History  Diagnosis Date  . 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 Chicken pox as a child  . Measles as a child  . Mumps as a child  . Allergy     hay fever  . Migraines   . Post-menopausal 08/24/2013  . Preventative health care 08/24/2013  . Left knee pain 08/24/2013  . Allergic state   . Mass of skin 05/03/2014  . 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 Marital Status: Married    Spouse Name: N/A    Number of Children: N/A  . Years of Education: N/A   Occupational History  . Not on file.   Social History Main Topics  . Smoking status: Never Smoker   . Smokeless tobacco: Never Used  . Alcohol Use: Yes     Comment: 2 drinks a month  . Drug Use: No  . Sexual Activity: Yes   Other Topics Concern  . Not on file   Social History Narrative  . No narrative on file    Past Surgical History  Procedure Laterality Date  . Abdominal hysterectomy  2002    partial- still has left ovary  . Right ovary  2005    removed  . Tonsillectomy  1979  . Wisdom tooth extraction  1980  . Birth mark removal Left 1967    forearm  . 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 Lupus Mother   . Diabetes Mother     type 2  . Hypertension Mother   . Heart disease Father     open heart surgery  . Hyperlipidemia Father   . Heart attack Father     several  . Diabetes Sister     type 2  . Heart disease Sister   . Diabetes Sister     type 2  . Hypertension Sister   . Thyroid disease Sister   . Kidney disease Sister   . Heart disease  Brother   . Hypertension Brother   . Diabetes Maternal Grandmother     type 2  . Heart attack Paternal Grandfather 42    massive  . Colon cancer Neg Hx   . Stomach cancer Neg Hx   . Rectal cancer Neg Hx     Allergies  Allergen Reactions  . Sulfa Antibiotics Hives  . Clindamycin/Lincomycin Hives and Rash  . Erythromycin Itching and Rash  . Keflex [Cephalexin] Rash  . Latex Rash  . Penicillins Rash  . Tetracyclines & Related Hives and Rash    Current Outpatient Prescriptions on File Prior to Visit  Medication Sig Dispense Refill  . fexofenadine (ALLEGRA) 180 MG tablet Take 180 mg by mouth daily.      . cefUROXime (CEFTIN) 250 MG tablet Take 1 tablet (250 mg total) by mouth 2 (two) times daily with a meal.  20 tablet  0   No current facility-administered medications on file prior to visit.  BP 111/70  Pulse 55  Temp(Src) 98.1 F (36.7 C) (Oral)  Ht 5' 4.5" (1.638 m)  Wt 140 lb 12.8 oz (63.866 kg)  BMI 23.80 kg/m2  SpO2 100%          Review of Systems  Constitutional: Negative for fever, chills and fatigue.  HENT: Negative for congestion, ear discharge, ear pain, postnasal drip, sinus pressure, sore throat and tinnitus.   Respiratory: Negative for cough, chest tightness and wheezing.   Cardiovascular: Negative for chest pain and palpitations.  Musculoskeletal: Negative for back pain, myalgias and neck pain.  Skin: Negative.        Areas on her scalp and posterior neck have cleared.  Neurological: Negative for dizziness, seizures, syncope, weakness, numbness and headaches.  Hematological: Negative for adenopathy.       Objective:   Physical Exam  Constitutional: She is oriented to person, place, and time. She appears well-developed and well-nourished.  Eyes: Conjunctivae and EOM are normal. Pupils are equal, round, and reactive to light.  Neck: Normal range of motion. Neck supple. No JVD present. No tracheal deviation present. No thyromegaly present.    Area on posterior scalp has cleared.   Rt side of neck posterior aspect. Lymph node behind  Sternocleidomastoid has decreased significantly in size. Barely palpable now.  Cardiovascular: Normal rate, regular rhythm and normal heart sounds.   Pulmonary/Chest: Effort normal and breath sounds normal. No stridor. No respiratory distress. She has no wheezes. She has no rales. She exhibits no tenderness.  Lymphadenopathy:    She has no cervical adenopathy.  Neurological: She is alert and oriented to person, place, and time. She displays normal reflexes. No cranial nerve deficit. She exhibits normal muscle tone.  Skin:  Area on posterior scalp has cleared. Follicles resolved.  Psychiatric: She has a normal mood and affect. Her behavior is normal. Judgment and thought content normal.          Assessment & Plan:

## 2014-09-21 NOTE — Assessment & Plan Note (Addendum)
Resolved no further treatment needed. Note a lymph node was slight enlarged in this area and now went to down in size corresponding with use of antibiotic.

## 2014-09-21 NOTE — Patient Instructions (Addendum)
Your folliculitis has appeared to resolve and your lymph node swelling has resolved as well. If this reoccurs then notify us. Keep appointment with PCP for complete PE in February. Otherwise follow up as needed.   This portion added after above printed. Fluvaccine was offered/recommended but she declined. I advised she could change her mind, let us know and come in for vaccine. If she does not get vaccine then advised come in early in event of flu like illness onset. Pt agreed she would.

## 2014-09-21 NOTE — Progress Notes (Signed)
Pre visit review using our clinic review tool, if applicable. No additional management support is needed unless otherwise documented below in the visit note. 

## 2014-11-23 ENCOUNTER — Encounter: Payer: BC Managed Care – PPO | Admitting: Family Medicine

## 2015-02-09 ENCOUNTER — Encounter: Payer: BC Managed Care – PPO | Admitting: Family Medicine

## 2016-02-11 ENCOUNTER — Ambulatory Visit (INDEPENDENT_AMBULATORY_CARE_PROVIDER_SITE_OTHER): Payer: BLUE CROSS/BLUE SHIELD | Admitting: Family Medicine

## 2016-02-11 ENCOUNTER — Encounter: Payer: Self-pay | Admitting: Family Medicine

## 2016-02-11 VITALS — BP 132/70 | HR 73 | Temp 98.1°F | Ht 65.0 in | Wt 139.2 lb

## 2016-02-11 DIAGNOSIS — T7840XD Allergy, unspecified, subsequent encounter: Secondary | ICD-10-CM | POA: Diagnosis not present

## 2016-02-11 DIAGNOSIS — Z578 Occupational exposure to other risk factors: Secondary | ICD-10-CM | POA: Diagnosis not present

## 2016-02-11 DIAGNOSIS — Z23 Encounter for immunization: Secondary | ICD-10-CM

## 2016-02-11 DIAGNOSIS — Z9189 Other specified personal risk factors, not elsewhere classified: Secondary | ICD-10-CM

## 2016-02-11 DIAGNOSIS — E041 Nontoxic single thyroid nodule: Secondary | ICD-10-CM | POA: Diagnosis not present

## 2016-02-11 DIAGNOSIS — Z0000 Encounter for general adult medical examination without abnormal findings: Secondary | ICD-10-CM

## 2016-02-11 DIAGNOSIS — M5136 Other intervertebral disc degeneration, lumbar region: Secondary | ICD-10-CM

## 2016-02-11 DIAGNOSIS — C499 Malignant neoplasm of connective and soft tissue, unspecified: Secondary | ICD-10-CM | POA: Diagnosis not present

## 2016-02-11 MED ORDER — FEXOFENADINE HCL 180 MG PO TABS: 180.0000 mg | ORAL_TABLET | Freq: Every day | ORAL | Status: DC

## 2016-02-11 NOTE — Patient Instructions (Addendum)
[facility]   Allergies An allergy is an abnormal reaction to a substance by the body's defense system (immune system). Allergies can develop at any age. WHAT CAUSES ALLERGIES? An allergic reaction happens when the immune system mistakenly reacts to a normally harmless substance, called an allergen, as if it were harmful. The immune system releases antibodies to fight the substance. Antibodies eventually release a chemical called histamine into the bloodstream. The release of histamine is meant to protect the body from infection, but it also causes discomfort. An allergic reaction can be triggered by:  Eating an allergen.  Inhaling an allergen.  Touching an allergen. WHAT TYPES OF ALLERGIES ARE THERE? There are many types of allergies. Common types include:  Seasonal allergies. People with this type of allergy are usually allergic to substances that are only present during certain seasons, such as molds and pollens.  Food allergies.  Drug allergies.  Insect allergies.  Animal dander allergies. WHAT ARE SYMPTOMS OF ALLERGIES? Possible allergy symptoms include:  Swelling of the lips, face, tongue, mouth, or throat.  Sneezing, coughing, or wheezing.  Nasal congestion.  Tingling in the mouth.  Rash.  Itching.  Itchy, red, swollen areas of skin (hives).  Watery eyes.  Vomiting.  Diarrhea.  Dizziness.  Lightheadedness.  Fainting.  Trouble breathing or swallowing.  Chest tightness.  Rapid heartbeat. HOW ARE ALLERGIES DIAGNOSED? Allergies are diagnosed with a medical and family history and one or more of the following:  Skin tests.  Blood tests.  A food diary. A food diary is a record of all the foods and drinks you have in a day and of all the symptoms you experience.  The results of an elimination diet. An elimination diet involves eliminating foods from your diet and then adding them back in one by one to find out if a certain food causes an allergic  reaction. HOW ARE ALLERGIES TREATED? There is no cure for allergies, but allergic reactions can be treated with medicine. Severe reactions usually need to be treated at a hospital. HOW CAN REACTIONS BE PREVENTED? The best way to prevent an allergic reaction is by avoiding the substance you are allergic to. Allergy shots and medicines can also help prevent reactions in some cases. People with severe allergic reactions may be able to prevent a life-threatening reaction called anaphylaxis with a medicine given right after exposure to the allergen.   This information is not intended to replace advice given to you by your health care provider. Make sure you discuss any questions you have with your health care provider.   Document Released: 02/13/2003 Document Revised: 12/11/2014 Document Reviewed: 09/01/2014 Elsevier Interactive Patient Education [employer].

## 2016-02-11 NOTE — Progress Notes (Signed)
Pre visit review using our clinic review tool, if applicable. No additional management support is needed unless otherwise documented below in the visit note. 

## 2016-02-12 LAB — HIV ANTIBODY (ROUTINE TESTING W REFLEX): HIV 1&2 Ab, 4th Generation: NONREACTIVE

## 2016-02-12 LAB — HEPATITIS C ANTIBODY: HCV Ab: NEGATIVE

## 2016-02-20 ENCOUNTER — Encounter: Payer: Self-pay | Admitting: Family Medicine

## 2016-02-20 DIAGNOSIS — M5136 Other intervertebral disc degeneration, lumbar region: Secondary | ICD-10-CM

## 2016-02-20 DIAGNOSIS — E041 Nontoxic single thyroid nodule: Secondary | ICD-10-CM | POA: Insufficient documentation

## 2016-02-20 HISTORY — DX: Other intervertebral disc degeneration, lumbar region: M51.36

## 2016-02-20 NOTE — Assessment & Plan Note (Signed)
Diagnosed August 2016, on left <1 cm. No intervention at this time will monitor

## 2016-02-20 NOTE — Progress Notes (Signed)
Patient ID: Krista Hampton, female   DOB: 08-Jun-1961, 55 y.o.   MRN: [id number]   Subjective:    Patient ID: Krista Hampton, female    DOB: July 03, 1961, 55 y.o.   MRN: [id number]  Chief Complaint  Patient presents with  . Follow-up    thyroid nodule    HPI Patient is in today for follow up. She feels well today, denies any recent illness or acute concerns. Has done well since the sarcoma was removed from her left leg back in 2015, now gets CT scans every 6 months. Also had a thyroid nodule diagnosed in August of 2016 but it was less than one cm. DDD was diagnosed at L2-5 and she manages her back pain well. Denies CP/palp/SOB/HA/congestion/fevers/GI or GU c/o. Taking meds as prescribed  Past Medical History  Diagnosis Date  . Stroke [facility]) 1986    mild after childbirth  . Chicken pox as a child  . Measles as a child  . Mumps as a child  . Allergy     hay fever  . Migraines   . Post-menopausal 08/24/2013  . Preventative health care 08/24/2013  . Left knee pain 08/24/2013  . Allergic state   . Mass of skin 05/03/2014  . Sarcoma ([place]) 01/13/2014  . DDD (degenerative disc disease), lumbar 02/20/2016    Past Surgical History  Procedure Laterality Date  . Abdominal hysterectomy  2002    partial- still has left ovary  . Right ovary  2005    removed  . Tonsillectomy  1979  . Wisdom tooth extraction  1980  . Birth mark removal Left 1967    forearm  . 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 Lupus Mother   . Diabetes Mother     type 2  . Hypertension Mother   . Heart disease Father     open heart surgery  . Hyperlipidemia Father   . Heart attack Father     several  . Diabetes Sister     type 2  . Heart disease Sister   . Diabetes Sister     type 2  . Hypertension Sister   . Thyroid disease Sister   . Kidney disease Sister   . Heart disease Brother   . Hypertension Brother   . Diabetes Maternal Grandmother     type 2  . Heart attack  Paternal Grandfather 42    massive  . Depression Paternal Grandfather   . Asthma Paternal Grandfather   . Colon cancer Neg Hx   . Stomach cancer Neg Hx   . Rectal cancer Neg Hx     Social History   Social History  . Marital Status: Married    Spouse Name: N/A  . Number of Children: N/A  . Years of Education: N/A   Occupational History  . Not on file.   Social History Main Topics  . Smoking status: Never Smoker   . Smokeless tobacco: Never Used  . Alcohol Use: Yes     Comment: 2 drinks a month  . Drug Use: No  . Sexual Activity: Yes   Other Topics Concern  . Not on file   Social History Narrative    Outpatient Prescriptions Prior to Visit  Medication Sig Dispense Refill  . fexofenadine (ALLEGRA) 180 MG tablet Take 180 mg by mouth daily.    . cefUROXime (CEFTIN) 250 MG tablet Take 1 tablet (250 mg total) by mouth 2 (two) times  daily with a meal. 20 tablet 0  . fexofenadine (ALLEGRA) 180 MG tablet Take 1 tablet (180 mg total) by mouth daily. 30 tablet 3   No facility-administered medications prior to visit.    Allergies  Allergen Reactions  . Sulfa Antibiotics Hives  . Clindamycin/Lincomycin Hives and Rash  . Erythromycin Itching and Rash  . Keflex [Cephalexin] Rash  . Latex Rash  . Penicillins Rash  . Tetracyclines & Related Hives and Rash    Review of Systems  Constitutional: Negative for fever and malaise/fatigue.  HENT: Negative for congestion.   Eyes: Negative for blurred vision.  Respiratory: Negative for shortness of breath.   Cardiovascular: Negative for chest pain, palpitations and leg swelling.  Gastrointestinal: Negative for nausea, abdominal pain and blood in stool.  Genitourinary: Negative for dysuria and frequency.  Musculoskeletal: Negative for falls.  Skin: Negative for rash.  Neurological: Negative for dizziness, loss of consciousness and headaches.  Endo/Heme/Allergies: Negative for environmental allergies.  Psychiatric/Behavioral:  Negative for depression. The patient is not nervous/anxious.        Objective:    Physical Exam  Constitutional: She is oriented to person, place, and time. She appears well-developed and well-nourished. No distress.  HENT:  Head: Normocephalic and atraumatic.  Nose: Nose normal.  Eyes: Right eye exhibits no discharge. Left eye exhibits no discharge.  Neck: Normal range of motion. Neck supple.  Cardiovascular: Normal rate and regular rhythm.   No murmur heard. Pulmonary/Chest: Effort normal and breath sounds normal.  Abdominal: Soft. Bowel sounds are normal. There is no tenderness.  Musculoskeletal: She exhibits no edema.  Neurological: She is alert and oriented to person, place, and time.  Skin: Skin is warm and dry.  Psychiatric: She has a normal mood and affect.  Nursing note and vitals reviewed.   BP 132/70 mmHg  Pulse 73  Temp(Src) 98.1 F (36.7 C) (Oral)  Ht 5\' 5"  (1.651 m)  Wt 139 lb 4 oz (63.163 kg)  BMI 23.17 kg/m2  SpO2 97% Wt Readings from Last 3 Encounters:  02/11/16 139 lb 4 oz (63.163 kg)  09/21/14 140 lb 12.8 oz (63.866 kg)  09/04/14 142 lb 6.4 oz (64.592 kg)     Lab Results  Component Value Date   WBC 6.1 08/22/2013   HGB 13.3 08/22/2013   HCT 38.7 08/22/2013   PLT 317 08/22/2013   GLUCOSE 80 08/22/2013   CHOL 205* 08/22/2013   TRIG 48 08/22/2013   HDL 81 08/22/2013   LDLCALC 114* 08/22/2013   ALT 12 08/22/2013   AST 15 08/22/2013   NA 135 08/22/2013   K 4.5 08/22/2013   CL 102 08/22/2013   CREATININE 0.73 08/22/2013   BUN 31* 08/22/2013   CO2 27 08/22/2013   TSH 1.059 08/22/2013    Lab Results  Component Value Date   TSH 1.059 08/22/2013   Lab Results  Component Value Date   WBC 6.1 08/22/2013   HGB 13.3 08/22/2013   HCT 38.7 08/22/2013   MCV 88.4 08/22/2013   PLT 317 08/22/2013   Lab Results  Component Value Date   NA 135 08/22/2013   K 4.5 08/22/2013   CO2 27 08/22/2013   GLUCOSE 80 08/22/2013   BUN 31* 08/22/2013    CREATININE 0.73 08/22/2013   BILITOT 0.4 08/22/2013   ALKPHOS 59 08/22/2013   AST 15 08/22/2013   ALT 12 08/22/2013   PROT 7.1 08/22/2013   ALBUMIN 4.7 08/22/2013   ALBUMIN 4.7 08/22/2013   CALCIUM 9.7 08/22/2013  Lab Results  Component Value Date   CHOL 205* 08/22/2013   Lab Results  Component Value Date   HDL 81 08/22/2013   Lab Results  Component Value Date   LDLCALC 114* 08/22/2013   Lab Results  Component Value Date   TRIG 48 08/22/2013   Lab Results  Component Value Date   CHOLHDL 2.5 08/22/2013   No results found for: HGBA1C     Assessment & Plan:   Problem List Items Addressed This Visit    RESOLVED: Allergic state    Doing well on Fexofenadine prn may continue the same      DDD (degenerative disc disease), lumbar    Encouraged moist heat and gentle stretching as tolerated. May try NSAIDs and prescription meds as directed and report if symptoms worsen or seek immediate care. Try Lidocaine patches prn      Preventative health care    Check labs today, including ep C and HIV per CDC guidelines.      [facility])    Has done very well with her treatment.      Relevant Medications   fexofenadine (ALLEGRA) 180 MG tablet   Thyroid nodule    Diagnosed August 2016, on left <1 cm. No intervention at this time will monitor       Other Visit Diagnoses    History of occupational exposure to risk factor    -  Primary    Relevant Orders    Hepatitis C Antibody (Completed)    HIV antibody (with reflex) (Completed)    Need for diphtheria-tetanus-pertussis (Tdap) vaccine, adult/adolescent        Relevant Orders    Tdap vaccine greater than or equal to 7yo IM (Completed)       I have discontinued Ms. Wenberg's cefUROXime. I am also having her maintain her fexofenadine.  Meds ordered this encounter  Medications  . DISCONTD: fexofenadine (ALLEGRA) 180 MG tablet    Sig: Take 1 tablet (180 mg total) by mouth daily.    Dispense:  30 tablet    Refill:  8    . fexofenadine (ALLEGRA) 180 MG tablet    Sig: Take 1 tablet (180 mg total) by mouth daily.    Dispense:  30 tablet    Refill:  8     Penni Homans, MD

## 2016-02-20 NOTE — Assessment & Plan Note (Signed)
Encouraged moist heat and gentle stretching as tolerated. May try NSAIDs and prescription meds as directed and report if symptoms worsen or seek immediate care. Try Lidocaine patches prn

## 2016-02-20 NOTE — Assessment & Plan Note (Signed)
Doing well on Fexofenadine prn may continue the same

## 2016-02-20 NOTE — Assessment & Plan Note (Signed)
Has done very well with her treatment.

## 2016-02-20 NOTE — Assessment & Plan Note (Signed)
Check labs today, including ep C and HIV per CDC guidelines.

## 2018-01-08 DIAGNOSIS — C4922 Malignant neoplasm of connective and soft tissue of left lower limb, including hip: Secondary | ICD-10-CM | POA: Diagnosis not present

## 2018-01-08 DIAGNOSIS — C499 Malignant neoplasm of connective and soft tissue, unspecified: Secondary | ICD-10-CM | POA: Diagnosis not present

## 2018-01-08 DIAGNOSIS — Z90710 Acquired absence of both cervix and uterus: Secondary | ICD-10-CM | POA: Diagnosis not present

## 2018-02-22 DIAGNOSIS — C44319 Basal cell carcinoma of skin of other parts of face: Secondary | ICD-10-CM | POA: Diagnosis not present

## 2018-02-22 DIAGNOSIS — D1801 Hemangioma of skin and subcutaneous tissue: Secondary | ICD-10-CM | POA: Diagnosis not present

## 2018-02-22 DIAGNOSIS — L720 Epidermal cyst: Secondary | ICD-10-CM | POA: Diagnosis not present

## 2018-02-22 DIAGNOSIS — D485 Neoplasm of uncertain behavior of skin: Secondary | ICD-10-CM | POA: Diagnosis not present

## 2018-02-22 DIAGNOSIS — D224 Melanocytic nevi of scalp and neck: Secondary | ICD-10-CM | POA: Diagnosis not present

## 2018-04-08 DIAGNOSIS — Z131 Encounter for screening for diabetes mellitus: Secondary | ICD-10-CM | POA: Diagnosis not present

## 2018-04-08 DIAGNOSIS — Z13220 Encounter for screening for lipoid disorders: Secondary | ICD-10-CM | POA: Diagnosis not present

## 2018-04-08 DIAGNOSIS — Z1321 Encounter for screening for nutritional disorder: Secondary | ICD-10-CM | POA: Diagnosis not present

## 2018-04-08 DIAGNOSIS — Z1329 Encounter for screening for other suspected endocrine disorder: Secondary | ICD-10-CM | POA: Diagnosis not present

## 2018-04-08 DIAGNOSIS — Z6823 Body mass index (BMI) 23.0-23.9, adult: Secondary | ICD-10-CM | POA: Diagnosis not present

## 2018-04-08 DIAGNOSIS — Z01419 Encounter for gynecological examination (general) (routine) without abnormal findings: Secondary | ICD-10-CM | POA: Diagnosis not present

## 2018-04-25 DIAGNOSIS — Z85828 Personal history of other malignant neoplasm of skin: Secondary | ICD-10-CM | POA: Diagnosis not present

## 2018-04-25 DIAGNOSIS — D2261 Melanocytic nevi of right upper limb, including shoulder: Secondary | ICD-10-CM | POA: Diagnosis not present

## 2018-05-29 ENCOUNTER — Other Ambulatory Visit: Payer: Self-pay | Admitting: Family Medicine

## 2018-05-29 ENCOUNTER — Other Ambulatory Visit: Payer: Self-pay | Admitting: Obstetrics and Gynecology

## 2018-05-29 DIAGNOSIS — Z1231 Encounter for screening mammogram for malignant neoplasm of breast: Secondary | ICD-10-CM

## 2018-05-31 ENCOUNTER — Ambulatory Visit
Admission: RE | Admit: 2018-05-31 | Discharge: 2018-05-31 | Disposition: A | Discharge status: Home / Self Care | Payer: BLUE CROSS/BLUE SHIELD | Source: Ambulatory Visit | Attending: Obstetrics and Gynecology | Admitting: Obstetrics and Gynecology

## 2018-05-31 DIAGNOSIS — Z1231 Encounter for screening mammogram for malignant neoplasm of breast: Secondary | ICD-10-CM | POA: Diagnosis not present

## 2018-07-17 DIAGNOSIS — C4922 Malignant neoplasm of connective and soft tissue of left lower limb, including hip: Secondary | ICD-10-CM | POA: Diagnosis not present

## 2018-07-17 DIAGNOSIS — C499 Malignant neoplasm of connective and soft tissue, unspecified: Secondary | ICD-10-CM | POA: Diagnosis not present

## 2018-07-19 DIAGNOSIS — D485 Neoplasm of uncertain behavior of skin: Secondary | ICD-10-CM | POA: Diagnosis not present

## 2018-07-19 DIAGNOSIS — L570 Actinic keratosis: Secondary | ICD-10-CM | POA: Diagnosis not present

## 2018-07-19 DIAGNOSIS — L820 Inflamed seborrheic keratosis: Secondary | ICD-10-CM | POA: Diagnosis not present

## 2018-10-07 DIAGNOSIS — H18462 Peripheral corneal degeneration, left eye: Secondary | ICD-10-CM | POA: Diagnosis not present

## 2018-10-22 DIAGNOSIS — H35432 Paving stone degeneration of retina, left eye: Secondary | ICD-10-CM | POA: Diagnosis not present

## 2018-10-22 DIAGNOSIS — H43312 Vitreous membranes and strands, left eye: Secondary | ICD-10-CM | POA: Diagnosis not present

## 2019-02-04 DIAGNOSIS — Z8589 Personal history of malignant neoplasm of other organs and systems: Secondary | ICD-10-CM | POA: Diagnosis not present

## 2019-02-04 DIAGNOSIS — J9811 Atelectasis: Secondary | ICD-10-CM | POA: Diagnosis not present

## 2019-02-04 DIAGNOSIS — Z85831 Personal history of malignant neoplasm of soft tissue: Secondary | ICD-10-CM | POA: Diagnosis not present

## 2019-02-04 DIAGNOSIS — C4922 Malignant neoplasm of connective and soft tissue of left lower limb, including hip: Secondary | ICD-10-CM | POA: Diagnosis not present

## 2019-05-12 DIAGNOSIS — Z85828 Personal history of other malignant neoplasm of skin: Secondary | ICD-10-CM | POA: Diagnosis not present

## 2019-05-12 DIAGNOSIS — L814 Other melanin hyperpigmentation: Secondary | ICD-10-CM | POA: Diagnosis not present

## 2019-05-12 DIAGNOSIS — D2261 Melanocytic nevi of right upper limb, including shoulder: Secondary | ICD-10-CM | POA: Diagnosis not present

## 2019-05-12 DIAGNOSIS — L821 Other seborrheic keratosis: Secondary | ICD-10-CM | POA: Diagnosis not present

## 2019-05-14 DIAGNOSIS — Z01419 Encounter for gynecological examination (general) (routine) without abnormal findings: Secondary | ICD-10-CM | POA: Diagnosis not present

## 2019-05-14 DIAGNOSIS — Z131 Encounter for screening for diabetes mellitus: Secondary | ICD-10-CM | POA: Diagnosis not present

## 2019-05-14 DIAGNOSIS — Z1329 Encounter for screening for other suspected endocrine disorder: Secondary | ICD-10-CM | POA: Diagnosis not present

## 2019-05-14 DIAGNOSIS — Z6824 Body mass index (BMI) 24.0-24.9, adult: Secondary | ICD-10-CM | POA: Diagnosis not present

## 2019-05-14 DIAGNOSIS — Z13220 Encounter for screening for lipoid disorders: Secondary | ICD-10-CM | POA: Diagnosis not present

## 2019-07-09 ENCOUNTER — Ambulatory Visit (INDEPENDENT_AMBULATORY_CARE_PROVIDER_SITE_OTHER): Payer: BC Managed Care – PPO | Admitting: Family

## 2019-07-09 ENCOUNTER — Other Ambulatory Visit: Payer: Self-pay

## 2019-07-09 DIAGNOSIS — Z20828 Contact with and (suspected) exposure to other viral communicable diseases: Secondary | ICD-10-CM | POA: Diagnosis not present

## 2019-07-09 DIAGNOSIS — Z20822 Contact with and (suspected) exposure to covid-19: Secondary | ICD-10-CM

## 2019-07-09 NOTE — Progress Notes (Signed)
Virtual Visit via Video Note  I connected with Krista Hampton on 07/09/19 at 12:20 PM EDT by a video enabled telemedicine application and verified that I am speaking with the correct person using two identifiers.  Location: Patient: home Provider: home   I discussed the limitations of evaluation and management by telemedicine and the availability of in person appointments. The patient expressed understanding and agreed to proceed.  History of Present Illness:   Patient is a 58 year old female who presents today to discuss possible COVID-19 exposure.  Her husband traveled out of town on July 27 and had exposure to a coworker who was ill.  He reports that this coworker recently had a positive COVID-19 test.  Today, her husband presented with some COVID-like symptoms including nasal congestion and cough.  He is being tested for COVID-19.  Patient is interested in also being screened.  Patient denies any symptoms including body aches, fever, sore throat, cough, and nasal congestion.  Past Medical History:  Diagnosis Date  . Allergic state   . Allergy    hay fever  . Chicken pox as a child  . DDD (degenerative disc disease), lumbar 02/20/2016  . Left knee pain 08/24/2013  . Mass of skin 05/03/2014  . Measles as a child  . Migraines   . Mumps as a child  . Post-menopausal 08/24/2013  . Preventative health care 08/24/2013  . 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 Stroke [facility]) 1986   mild after childbirth     Social History   Socioeconomic History  . Marital status: Married    Spouse name: Not on file  . Number of children: Not on file  . Years of education: Not on file  . Highest education level: Not on file  Occupational History  . Not on file  Social Needs  . Financial resource strain: Not on file  . Food insecurity    Worry: Not on file    Inability: Not on file  . Transportation needs    Medical: Not on file    Non-medical: Not on file  Tobacco Use  . Smoking status: Never Smoker  .  Smokeless tobacco: Never Used  Substance and Sexual Activity  . Alcohol use: Yes    Comment: 2 drinks a month  . Drug use: No  . Sexual activity: Yes  Lifestyle  . Physical activity    Days per week: Not on file    Minutes per session: Not on file  . Stress: Not on file  Relationships  . Social Herbalist on phone: Not on file    Gets together: Not on file    Attends religious service: Not on file    Active member of club or organization: Not on file    Attends meetings of clubs or organizations: Not on file    Relationship status: Not on file  . Intimate partner violence    Fear of current or ex partner: Not on file    Emotionally abused: Not on file    Physically abused: Not on file    Forced sexual activity: Not on file  Other Topics Concern  . Not on file  Social History Narrative  . Not on file    Past Surgical History:  Procedure Laterality Date  . ABDOMINAL HYSTERECTOMY  2002   partial- still has left ovary  . birth mark removal Left 1967   forearm  . right ovary  2005   removed  . sarcoma removal Left 01/23/2014  .  TONSILLECTOMY  1979  . WISDOM TOOTH EXTRACTION  1980    Family History  Problem Relation Age of Onset  . Lupus Mother   . Diabetes Mother        type 2  . Hypertension Mother   . Heart disease Father        open heart surgery  . Hyperlipidemia Father   . Heart attack Father        several  . Diabetes Sister        type 2  . Heart disease Sister   . Diabetes Sister        type 2  . Hypertension Sister   . Thyroid disease Sister   . Kidney disease Sister   . Heart disease Brother   . Hypertension Brother   . Diabetes Maternal Grandmother        type 2  . Heart attack Paternal Grandfather 42       massive  . Depression Paternal Grandfather   . Asthma Paternal Grandfather   . Colon cancer Neg Hx   . Stomach cancer Neg Hx   . Rectal cancer Neg Hx     Allergies  Allergen Reactions  . Sulfa Antibiotics Hives  .  Clindamycin/Lincomycin Hives and Rash  . Erythromycin Itching and Rash  . Keflex [Cephalexin] Rash  . Latex Rash  . Penicillins Rash  . Tetracyclines & Related Hives and Rash    Current Outpatient Medications on File Prior to Visit  Medication Sig Dispense Refill  . fexofenadine (ALLEGRA) 180 MG tablet Take 1 tablet (180 mg total) by mouth daily. 30 tablet 8   No current facility-administered medications on file prior to visit.     There were no vitals taken for this visit.   Observations/Objective:   Gen: Awake, alert, no acute distress Resp: Breathing is even and non-labored Psych: calm/pleasant demeanor Neuro: Alert and Oriented x 3, + facial symmetry, speech is clear.   Assessment and Plan:  Possible COVID-19 exposure- I have suggested that she be screened for COVID-19 at our local offsite drive-through testing site.  I gave her the information.  Order will be placed.  We discussed need to self quarantine until results return and further recommendations. I have advised the patient to go to our office light testing for COVID swab.  In the meantime we discussed need to self quarantine until results are reviewed and further recommendations.  Recommended supportive measures including rest, hydration, Tylenol as needed for body aches/fever and Delsym as needed for cough.  We discussed red flags that should prompt ED visit which include shortness of breath severe or worsening cough.  Patient verbalizes understanding and agrees to plan.  Follow Up Instructions:    I discussed the assessment and treatment plan with the patient. The patient was provided an opportunity to ask questions and all were answered. The patient agreed with the plan and demonstrated an understanding of the instructions.   The patient was advised to call back or seek an in-person evaluation if the symptoms worsen or if the condition fails to improve as anticipated.  Nance Pear, NP

## 2019-07-10 LAB — NOVEL CORONAVIRUS, NAA: SARS-CoV-2, NAA: NOT DETECTED

## 2019-07-11 ENCOUNTER — Encounter: Payer: Self-pay | Admitting: Family

## 2019-10-23 ENCOUNTER — Other Ambulatory Visit: Payer: Self-pay

## 2019-10-23 DIAGNOSIS — Z20822 Contact with and (suspected) exposure to covid-19: Secondary | ICD-10-CM

## 2019-10-25 LAB — NOVEL CORONAVIRUS, NAA: SARS-CoV-2, NAA: NOT DETECTED

## 2020-01-26 DIAGNOSIS — H43812 Vitreous degeneration, left eye: Secondary | ICD-10-CM | POA: Diagnosis not present

## 2020-02-11 DIAGNOSIS — Z23 Encounter for immunization: Secondary | ICD-10-CM | POA: Diagnosis not present

## 2020-02-11 DIAGNOSIS — C499 Malignant neoplasm of connective and soft tissue, unspecified: Secondary | ICD-10-CM | POA: Diagnosis not present

## 2020-04-08 ENCOUNTER — Other Ambulatory Visit: Payer: Self-pay | Admitting: Obstetrics and Gynecology

## 2020-04-08 DIAGNOSIS — Z1231 Encounter for screening mammogram for malignant neoplasm of breast: Secondary | ICD-10-CM

## 2020-04-20 ENCOUNTER — Ambulatory Visit
Admission: RE | Admit: 2020-04-20 | Discharge: 2020-04-20 | Disposition: A | Discharge status: Home / Self Care | Payer: BC Managed Care – PPO | Source: Ambulatory Visit | Attending: Obstetrics and Gynecology | Admitting: Obstetrics and Gynecology

## 2020-04-20 ENCOUNTER — Other Ambulatory Visit: Payer: Self-pay

## 2020-04-20 DIAGNOSIS — Z1231 Encounter for screening mammogram for malignant neoplasm of breast: Secondary | ICD-10-CM | POA: Diagnosis not present

## 2020-05-12 DIAGNOSIS — L814 Other melanin hyperpigmentation: Secondary | ICD-10-CM | POA: Diagnosis not present

## 2020-05-12 DIAGNOSIS — D2261 Melanocytic nevi of right upper limb, including shoulder: Secondary | ICD-10-CM | POA: Diagnosis not present

## 2020-05-12 DIAGNOSIS — D2262 Melanocytic nevi of left upper limb, including shoulder: Secondary | ICD-10-CM | POA: Diagnosis not present

## 2020-05-12 DIAGNOSIS — Z85828 Personal history of other malignant neoplasm of skin: Secondary | ICD-10-CM | POA: Diagnosis not present

## 2020-05-25 DIAGNOSIS — Z13220 Encounter for screening for lipoid disorders: Secondary | ICD-10-CM | POA: Diagnosis not present

## 2020-05-25 DIAGNOSIS — Z1329 Encounter for screening for other suspected endocrine disorder: Secondary | ICD-10-CM | POA: Diagnosis not present

## 2020-05-25 DIAGNOSIS — Z01419 Encounter for gynecological examination (general) (routine) without abnormal findings: Secondary | ICD-10-CM | POA: Diagnosis not present

## 2020-05-25 DIAGNOSIS — Z131 Encounter for screening for diabetes mellitus: Secondary | ICD-10-CM | POA: Diagnosis not present

## 2020-05-25 DIAGNOSIS — Z6821 Body mass index (BMI) 21.0-21.9, adult: Secondary | ICD-10-CM | POA: Diagnosis not present

## 2020-05-26 ENCOUNTER — Encounter: Payer: Self-pay | Admitting: Family Medicine

## 2020-06-14 ENCOUNTER — Other Ambulatory Visit: Payer: Self-pay

## 2020-06-14 ENCOUNTER — Ambulatory Visit (INDEPENDENT_AMBULATORY_CARE_PROVIDER_SITE_OTHER): Payer: BC Managed Care – PPO | Admitting: Medical

## 2020-06-14 VITALS — BP 130/87 | HR 65 | Temp 98.4°F | Resp 18 | Ht 65.0 in | Wt 129.4 lb

## 2020-06-14 DIAGNOSIS — R42 Dizziness and giddiness: Secondary | ICD-10-CM | POA: Diagnosis not present

## 2020-06-14 MED ORDER — FLUTICASONE PROPIONATE 50 MCG/ACT NA SUSP: 2.0000 | Freq: Every day | NASAL | 1 refills | Status: DC

## 2020-06-14 MED ORDER — MECLIZINE HCL 12.5 MG PO TABS: 12.5000 mg | ORAL_TABLET | Freq: Three times a day (TID) | ORAL | 0 refills | Status: DC | PRN

## 2020-06-14 NOTE — Patient Instructions (Addendum)
You do have history of benign positional vertigo.  Recent vertigo over the last couple of days but today none on exam.  Also you had very good neurologic exam.  Hard to predict when next event might occur.  I think is a good idea if you to try meclizine.  Stop Benadryl or other over-the-counter antihistamine.  Would try meclizine 1 tablet tonight before you go to sleep since it has caused you sedation in the past.  Make sure you well-hydrated.  Prescription of Flonase given he does not have any mild nasal congestion/allergy component going along with the vertigo.  If you develop any associated neurologic type signs and symptoms with vertigo please let us know.  Any such signs symptoms severe and after hours and recommend ED evaluation.  Follow-up as regular scheduled with PCP or as needed.

## 2020-06-14 NOTE — Progress Notes (Signed)
Subjective:    Patient ID: Krista Hampton, female    DOB: 1961-07-22, 59 y.o.   MRN: [id number]  HPI  Pt in for benign positional vertigo. Had for years.  Recent mild transient vertigo for past 2 days. Most of time transient and position related. This morning had vertigo but none presently.  Pt states in the past she would take allegra or benadryl.   Pt has some mild sneezing. Maybe some mild nasal congestion.   Pt tilts her head and will spin just little bit.   No ha, no vision changes or any gross motor/sensory function deficits.    Review of Systems  Constitutional: Negative for chills, diaphoresis and fatigue.  Respiratory: Negative for cough, chest tightness, shortness of breath and wheezing.   Cardiovascular: Negative for chest pain and palpitations.  Gastrointestinal: Negative for abdominal pain, diarrhea and vomiting.  Musculoskeletal: Negative for back pain.  Neurological: Positive for dizziness. Negative for syncope, speech difficulty, weakness and light-headedness.  Hematological: Negative for adenopathy. Does not bruise/bleed easily.  Psychiatric/Behavioral: Negative for behavioral problems, hallucinations and sleep disturbance. The patient is not nervous/anxious.      Past Medical History:  Diagnosis Date  . Allergic state   . Allergy    hay fever  . Chicken pox as a child  . DDD (degenerative disc disease), lumbar 02/20/2016  . Left knee pain 08/24/2013  . Mass of skin 05/03/2014  . Measles as a child  . Migraines   . Mumps as a child  . Post-menopausal 08/24/2013  . Preventative health care 08/24/2013  . 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 Stroke [facility]) 1986   mild after childbirth     Social History   Socioeconomic History  . Marital status: Married    Spouse name: Not on file  . Number of children: Not on file  . Years of education: Not on file  . Highest education level: Not on file  Occupational History  . Not on file  Tobacco Use  . Smoking status:  Never Smoker  . Smokeless tobacco: Never Used  Substance and Sexual Activity  . Alcohol use: Yes    Comment: 2 drinks a month  . Drug use: No  . Sexual activity: Yes  Other Topics Concern  . Not on file  Social History Narrative  . Not on file   Social Determinants of Health   Financial Resource Strain:   . Difficulty of Paying Living Expenses:   Food Insecurity:   . Worried About Charity fundraiser in the Last Year:   . Arboriculturist in the Last Year:   Transportation Needs:   . Film/video editor (Medical):   Marland Kitchen Lack of Transportation (Non-Medical):   Physical Activity:   . Days of Exercise per Week:   . Minutes of Exercise per Session:   Stress:   . Feeling of Stress :   Social Connections:   . Frequency of Communication with Friends and Family:   . Frequency of Social Gatherings with Friends and Family:   . Attends Religious Services:   . Active Member of Clubs or Organizations:   . Attends Archivist Meetings:   Marland Kitchen Marital Status:   Intimate Partner Violence:   . Fear of Current or Ex-Partner:   . Emotionally Abused:   Marland Kitchen Physically Abused:   . Sexually Abused:     Past Surgical History:  Procedure Laterality Date  . ABDOMINAL HYSTERECTOMY  2002   partial- still has left  ovary  . birth mark removal Left 1967   forearm  . right ovary  2005   removed  . sarcoma removal Left 01/23/2014  . TONSILLECTOMY  1979  . WISDOM TOOTH EXTRACTION  1980    Family History  Problem Relation Age of Onset  . Lupus Mother   . Diabetes Mother        type 2  . Hypertension Mother   . Heart disease Father        open heart surgery  . Hyperlipidemia Father   . Heart attack Father        several  . Diabetes Sister        type 2  . Heart disease Sister   . Diabetes Sister        type 2  . Hypertension Sister   . Thyroid disease Sister   . Kidney disease Sister   . Heart disease Brother   . Hypertension Brother   . Diabetes Maternal Grandmother         type 2  . Heart attack Paternal Grandfather 42       massive  . Depression Paternal Grandfather   . Asthma Paternal Grandfather   . Colon cancer Neg Hx   . Stomach cancer Neg Hx   . Rectal cancer Neg Hx     Allergies  Allergen Reactions  . Sulfa Antibiotics Hives  . Clindamycin/Lincomycin Hives and Rash  . Erythromycin Itching and Rash  . Keflex [Cephalexin] Rash  . Latex Rash  . Penicillins Rash  . Tetracyclines & Related Hives and Rash    Current Outpatient Medications on File Prior to Visit  Medication Sig Dispense Refill  . fexofenadine (ALLEGRA) 180 MG tablet Take 1 tablet (180 mg total) by mouth daily. 30 tablet 8   No current facility-administered medications on file prior to visit.    BP 130/87 (BP Location: Left Arm, Patient Position: Sitting, Cuff Size: Normal)   Pulse 65   Temp 98.4 F (36.9 C) (Temporal)   Resp 18   Ht 5\' 5"  (1.651 m)   Wt 129 lb 6.4 oz (58.7 kg)   SpO2 100%   BMI 21.53 kg/m       Objective:   Physical Exam  General Mental Status- Alert. General Appearance- Not in acute distress.   Skin General: Color- Normal Color. Moisture- Normal Moisture.  Neck Carotid Arteries- Normal color. Moisture- Normal Moisture. No carotid bruits. No JVD.  Chest and Lung Exam Auscultation: Breath Sounds:-Normal.  Cardiovascular Auscultation:Rythm- Regular. Murmurs & Other Heart Sounds:Auscultation of the heart reveals- No Murmurs.  Abdomen Inspection:-Inspeection Normal. Palpation/Percussion:Note:No mass. Palpation and Percussion of the abdomen reveal- Non Tender, Non Distended + BS, no rebound or guarding.   l Nerve exam:- CN III-XII intact(No nystagmus), symmetric smile. Drift Test:- No drift. Romberg Exam:- Negative.  Heal to Toe Gait exam:-Normal. Finger to Nose:- Normal/Intact Strength:- 5/5 equal and symmetric strength both upper and lower extremities. Lying down- head turns to rt and left no spinning.       Assessment & Plan:    You do have history of benign positional vertigo.  Recent vertigo over the last couple of days but today none on exam.  Also he had very good neurologic exam.  Hard to predict when next event might occur.  I think is a good idea if you to trial meclizine.  Stop Benadryl or other over-the-counter antihistamine.  Would try meclizine 1 tablet tonight before you go to sleep since it has  caused you sedation in the past.  Make sure you well-hydrated.  Prescription of Flonase given he does not have any mild nasal congestion/allergy component going along with the vertigo.  If you develop any associated neurologic type signs and symptoms with vertigo please let us know.  Any such signs symptoms severe and after hours and recommend ED evaluation.  Follow-up as regular scheduled with PCP or as needed.  Mackie Pai, PA-C

## 2020-06-29 DIAGNOSIS — R05 Cough: Secondary | ICD-10-CM | POA: Diagnosis not present

## 2020-06-29 DIAGNOSIS — J029 Acute pharyngitis, unspecified: Secondary | ICD-10-CM | POA: Diagnosis not present

## 2020-06-29 DIAGNOSIS — Z20822 Contact with and (suspected) exposure to covid-19: Secondary | ICD-10-CM | POA: Diagnosis not present

## 2020-08-10 ENCOUNTER — Other Ambulatory Visit: Payer: Self-pay | Admitting: Medical

## 2020-08-17 ENCOUNTER — Encounter: Payer: Self-pay | Admitting: Family Medicine

## 2020-10-13 DIAGNOSIS — H43812 Vitreous degeneration, left eye: Secondary | ICD-10-CM | POA: Diagnosis not present

## 2020-10-13 DIAGNOSIS — H52203 Unspecified astigmatism, bilateral: Secondary | ICD-10-CM | POA: Diagnosis not present

## 2020-10-13 DIAGNOSIS — H5213 Myopia, bilateral: Secondary | ICD-10-CM | POA: Diagnosis not present

## 2021-05-06 DIAGNOSIS — Z20822 Contact with and (suspected) exposure to covid-19: Secondary | ICD-10-CM | POA: Diagnosis not present

## 2021-05-12 DIAGNOSIS — D225 Melanocytic nevi of trunk: Secondary | ICD-10-CM | POA: Diagnosis not present

## 2021-05-12 DIAGNOSIS — D2262 Melanocytic nevi of left upper limb, including shoulder: Secondary | ICD-10-CM | POA: Diagnosis not present

## 2021-05-12 DIAGNOSIS — Z85828 Personal history of other malignant neoplasm of skin: Secondary | ICD-10-CM | POA: Diagnosis not present

## 2021-05-12 DIAGNOSIS — D3611 Benign neoplasm of peripheral nerves and autonomic nervous system of face, head, and neck: Secondary | ICD-10-CM | POA: Diagnosis not present

## 2021-05-12 DIAGNOSIS — L814 Other melanin hyperpigmentation: Secondary | ICD-10-CM | POA: Diagnosis not present

## 2021-06-02 DIAGNOSIS — Z01419 Encounter for gynecological examination (general) (routine) without abnormal findings: Secondary | ICD-10-CM | POA: Diagnosis not present

## 2021-06-02 DIAGNOSIS — Z6822 Body mass index (BMI) 22.0-22.9, adult: Secondary | ICD-10-CM | POA: Diagnosis not present

## 2021-06-02 DIAGNOSIS — R319 Hematuria, unspecified: Secondary | ICD-10-CM | POA: Diagnosis not present

## 2021-06-02 DIAGNOSIS — Z13820 Encounter for screening for osteoporosis: Secondary | ICD-10-CM | POA: Diagnosis not present

## 2021-06-02 DIAGNOSIS — Z131 Encounter for screening for diabetes mellitus: Secondary | ICD-10-CM | POA: Diagnosis not present

## 2021-06-02 DIAGNOSIS — Z13220 Encounter for screening for lipoid disorders: Secondary | ICD-10-CM | POA: Diagnosis not present

## 2021-06-02 DIAGNOSIS — Z1329 Encounter for screening for other suspected endocrine disorder: Secondary | ICD-10-CM | POA: Diagnosis not present

## 2021-06-02 DIAGNOSIS — Z1231 Encounter for screening mammogram for malignant neoplasm of breast: Secondary | ICD-10-CM | POA: Diagnosis not present

## 2021-06-02 LAB — TSH: TSH: 2.04 (ref 0.41–5.90)

## 2021-06-02 LAB — LIPID PANEL
Cholesterol: 237 — AB (ref 0–200)
HDL: 112 — AB (ref 35–70)
LDL Cholesterol: 118
LDl/HDL Ratio: 1.1
Triglycerides: 43 (ref 40–160)

## 2021-06-02 LAB — HEMOGLOBIN A1C: Hemoglobin A1C: 5.6

## 2021-06-07 ENCOUNTER — Encounter: Payer: Self-pay | Admitting: Family Medicine

## 2021-06-07 ENCOUNTER — Encounter: Payer: Self-pay | Admitting: *Deleted

## 2021-06-09 ENCOUNTER — Encounter: Payer: Self-pay | Admitting: Medical

## 2021-06-28 ENCOUNTER — Ambulatory Visit (INDEPENDENT_AMBULATORY_CARE_PROVIDER_SITE_OTHER): Payer: BC Managed Care – PPO | Admitting: Medical

## 2021-06-28 ENCOUNTER — Other Ambulatory Visit: Payer: Self-pay

## 2021-06-28 VITALS — BP 133/51 | HR 68 | Temp 98.5°F | Resp 18 | Ht 65.0 in | Wt 128.0 lb

## 2021-06-28 DIAGNOSIS — E785 Hyperlipidemia, unspecified: Secondary | ICD-10-CM

## 2021-06-28 DIAGNOSIS — M8580 Other specified disorders of bone density and structure, unspecified site: Secondary | ICD-10-CM

## 2021-06-28 DIAGNOSIS — R5383 Other fatigue: Secondary | ICD-10-CM

## 2021-06-28 DIAGNOSIS — Z7185 Encounter for immunization safety counseling: Secondary | ICD-10-CM | POA: Diagnosis not present

## 2021-06-28 LAB — LIPID PANEL
Cholesterol: 216 mg/dL — ABNORMAL HIGH (ref 0–200)
HDL: 91.7 mg/dL (ref >39.00)
LDL Cholesterol: 115 mg/dL — ABNORMAL HIGH (ref 0–99)
NonHDL: 124.44
Total CHOL/HDL Ratio: 2
Triglycerides: 49 mg/dL (ref 0.0–149.0)
VLDL: 9.8 mg/dL (ref 0.0–40.0)

## 2021-06-28 LAB — COMPREHENSIVE METABOLIC PANEL
ALT: 11 U/L (ref 0–35)
AST: 18 U/L (ref 0–37)
Albumin: 4.8 g/dL (ref 3.5–5.2)
Alkaline Phosphatase: 67 U/L (ref 39–117)
BUN: 17 mg/dL (ref 6–23)
CO2: 28 mEq/L (ref 19–32)
Calcium: 10 mg/dL (ref 8.4–10.5)
Chloride: 102 mEq/L (ref 96–112)
Creatinine, Ser: 0.8 mg/dL (ref 0.40–1.20)
GFR: 80.5 mL/min (ref >60.00)
Glucose, Bld: 86 mg/dL (ref 70–99)
Potassium: 4.5 mEq/L (ref 3.5–5.1)
Sodium: 139 mEq/L (ref 135–145)
Total Bilirubin: 0.5 mg/dL (ref 0.2–1.2)
Total Protein: 7 g/dL (ref 6.0–8.3)

## 2021-06-28 LAB — T4, FREE: Free T4: 0.87 ng/dL (ref 0.60–1.60)

## 2021-06-28 LAB — VITAMIN D 25 HYDROXY (VIT D DEFICIENCY, FRACTURES): VITD: 75.3 ng/mL (ref 30.00–100.00)

## 2021-06-28 LAB — TSH: TSH: 1.66 u[IU]/mL (ref 0.35–5.50)

## 2021-06-28 NOTE — Progress Notes (Signed)
Subjective:    Patient ID: Krista Hampton, female    DOB: 1961/06/08, 60 y.o.   MRN: [id number]  HPI  Pt has hx of high cholesterol total 237 and ldl 118. Last year total 201 and ldl 102.  Pt states she eats fish, nuts, fruits and vegetable. She avoid red meat, pork and fried foods.   Before pt last labs she was on vacation for one week. She was eating some pizza, fries, hush puppies and some fried fish. Normally very strict.  Last month she got back on strict diet.   Pt taking fish oil and other over counter supplament. No hx of smoking. Mom and dad do have heart disease. Mom had 7 stents. Dad had heart attack at 21 yo. 2 sisters had stents. 3 siblings no dx CAD. Also pt is on citrus bergamot.  No htn. No diabetes. Not obese.  Pt has dx of osteopenia. Dexascan recently June 30th.  Pt wants vit D.   Review of Systems  Constitutional:  Negative for chills, fatigue and fever.  HENT:  Negative for dental problem.   Respiratory:  Negative for cough and chest tightness.   Cardiovascular:  Negative for chest pain and palpitations.  Gastrointestinal:  Negative for abdominal pain.  Endocrine: Negative for polydipsia and polyuria.  Genitourinary:  Negative for difficulty urinating, dysuria, flank pain and frequency.  Musculoskeletal:  Negative for arthralgias and myalgias.    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   Stroke ([place]) 1986   mild after childbirth     Social History   Socioeconomic History   Marital status: Married    Spouse name: Not on file   Number of children: Not on file   Years of education: Not on file   Highest education level: Not on file   Occupational History   Not on file  Tobacco Use   Smoking status: Never   Smokeless tobacco: Never  Substance and Sexual Activity   Alcohol use: Yes    Comment: 2 drinks a month   Drug use: No   Sexual activity: Yes  Other Topics Concern   Not on file  Social History Narrative   Not on file   Social Determinants of Health   Financial Resource Strain: Not on file  Food Insecurity: Not on file  Transportation Needs: Not on file  Physical Activity: Not on file  Stress: Not on file  Social Connections: Not on file  Intimate Partner Violence: Not on file    Past Surgical History:  Procedure Laterality Date   ABDOMINAL HYSTERECTOMY  2002   partial- still has left ovary   birth mark removal Left 1967   forearm   right ovary  2005   removed   sarcoma removal Left 01/23/2014   TONSILLECTOMY  1979   WISDOM TOOTH EXTRACTION  1980    Family History  Problem Relation Age of Onset   Lupus Mother    Diabetes Mother        type 2   Hypertension Mother    Heart disease Father  open heart surgery   Hyperlipidemia Father    Heart attack Father        several   Diabetes Sister        type 2   Heart disease Sister    Diabetes Sister        type 2   Hypertension Sister    Thyroid disease Sister    Kidney disease Sister    Heart disease Brother    Hypertension Brother    Diabetes Maternal Grandmother        type 2   Heart attack Paternal Grandfather 52       massive   Depression Paternal Grandfather    Asthma Paternal Grandfather    Colon cancer Neg Hx    Stomach cancer Neg Hx    Rectal cancer Neg Hx     Allergies  Allergen Reactions   Sulfa Antibiotics Hives   Clindamycin/Lincomycin Hives and Rash   Erythromycin Itching and Rash   Keflex [Cephalexin] Rash   Latex Rash   Penicillins Rash   Tetracyclines & Related Hives and Rash    Current Outpatient Medications on File Prior to Visit  Medication Sig Dispense Refill   Biotin 10000 MCG TABS Take by  mouth.     calcium-vitamin D (OSCAL WITH D) 500-200 MG-UNIT tablet Take 1 tablet by mouth.     cholecalciferol (VITAMIN D3) 25 MCG (1000 UNIT) tablet Take 1,000 Units by mouth daily. 500 IU     Krill Oil 500 MG CAPS Take by mouth.     Omega-3 Fatty Acids (EPA PO) Take by mouth.     Omega-3 Fatty Acids (OMEGA-3 FISH OIL PO) Take by mouth.     Tart Cherry (TART CHERRY ULTRA) 1200 MG CAPS Take by mouth.     UNABLE TO FIND Med Name: [place] Citrus Bergam[place] 500 mg     fexofenadine (ALLEGRA) 180 MG tablet Take 1 tablet (180 mg total) by mouth daily. 30 tablet 8   fluticasone (FLONASE) 50 MCG/ACT nasal spray SHAKE LIQUID AND USE 2 SPRAYS IN EACH NOSTRIL DAILY 16 g 1   meclizine (ANTIVERT) 12.5 MG tablet Take 1 tablet (12.5 mg total) by mouth 3 (three) times daily as needed for dizziness. 30 tablet 0   No current facility-administered medications on file prior to visit.    BP (!) 133/51   Pulse 68   Temp 98.5 F (36.9 C)   Resp 18   Ht '5\' 5"'$  (1.651 m)   Wt 128 lb (58.1 kg)   SpO2 100%   BMI 21.30 kg/m        Objective:   Physical Exam  General Mental Status- Alert. General Appearance- Not in acute distress.   Skin General: Color- Normal Color. Moisture- Normal Moisture.  Neck Carotid Arteries- Normal color. Moisture- Normal Moisture. No carotid bruits. No JVD.  Chest and Lung Exam Auscultation: Breath Sounds:-Normal.  Cardiovascular Auscultation:Rythm- Regular. Murmurs & Other Heart Sounds:Auscultation of the heart reveals- No Murmurs.  Abdomen Inspection:-Inspeection Normal. Palpation/Percussion:Note:No mass. Palpation and Percussion of the abdomen reveal- Non Tender, Non Distended + BS, no rebound or guarding.   Neurologic Cranial Nerve exam:- CN III-XII intact(No nystagmus), symmetric smile. Strength:- 5/5 equal and symmetric strength both upper and lower extremities.       Assessment & Plan:   For high cholesterol will repeat lipid panel and cmp today. Well  see how you have done with diet, fish oil and bergomot.  Will update you on 10 year cardiovascular risk score.  For osteopenia, will get vit d level. Continue weight bearing exercises.  For mild fatigue will get tsh and t4 level.  Counseled on getting covid vaccine booster.  Follow up date to be determined after lab review.  Mackie Pai, PA-C   Time spent with patient today was  31 minutes which consisted of chart review, discussing diagnosis, work up, treatment and documentation.

## 2021-06-28 NOTE — Patient Instructions (Addendum)
For high cholesterol will repeat lipid panel and cmp today. Well see how you have done with diet, fish oil and bergomot.  Will update you on 10 year cardiovascular risk score.  For osteopenia, will get vit d level. Continue weight bearing exercises.  For mild fatigue will get tsh and t4 level.  Counseled on getting covid vaccine booster.  Follow up date to be determined after lab review.

## 2021-07-04 IMAGING — MG DIGITAL SCREENING BILAT W/ TOMO W/ CAD
8 series · 9 of 24 positions shown · non-contrast
Comparison: Previous exam(s).

CLINICAL DATA: Screening.

EXAM:
DIGITAL SCREENING BILATERAL MAMMOGRAM WITH TOMO AND CAD

[L CC synth-2D]
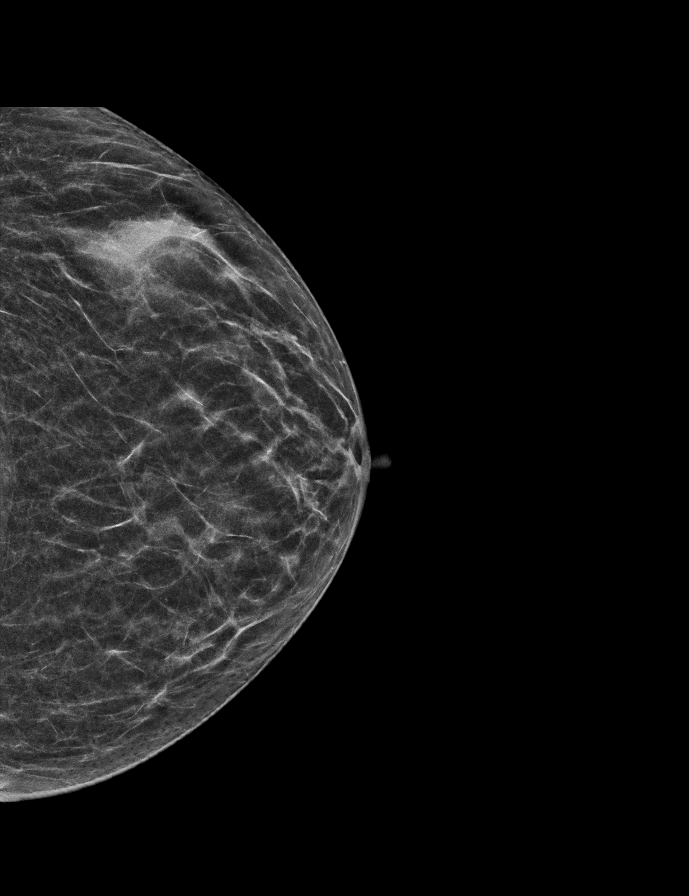

[R MLO synth-2D]
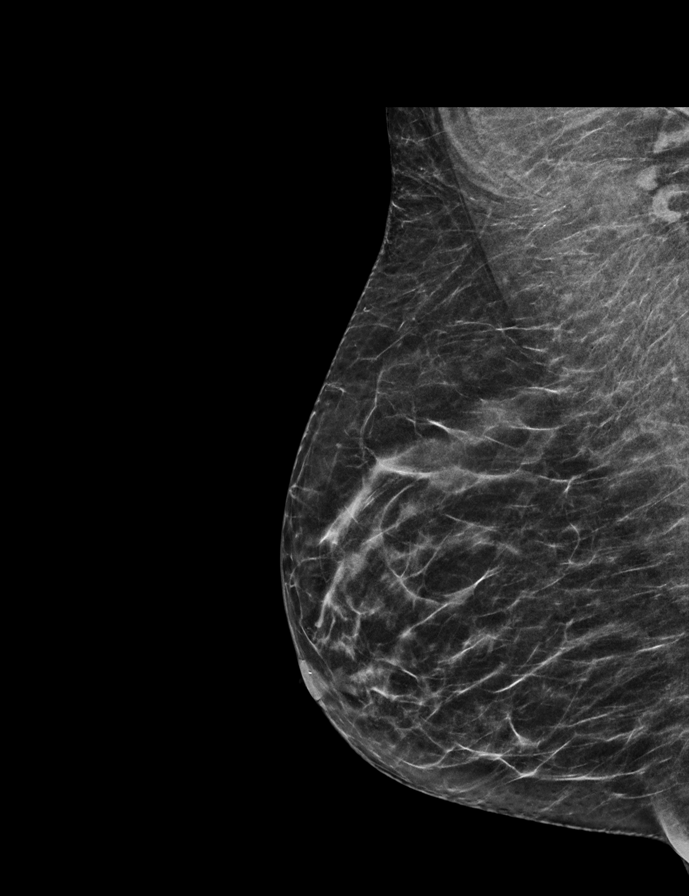

[L MLO synth-2D]
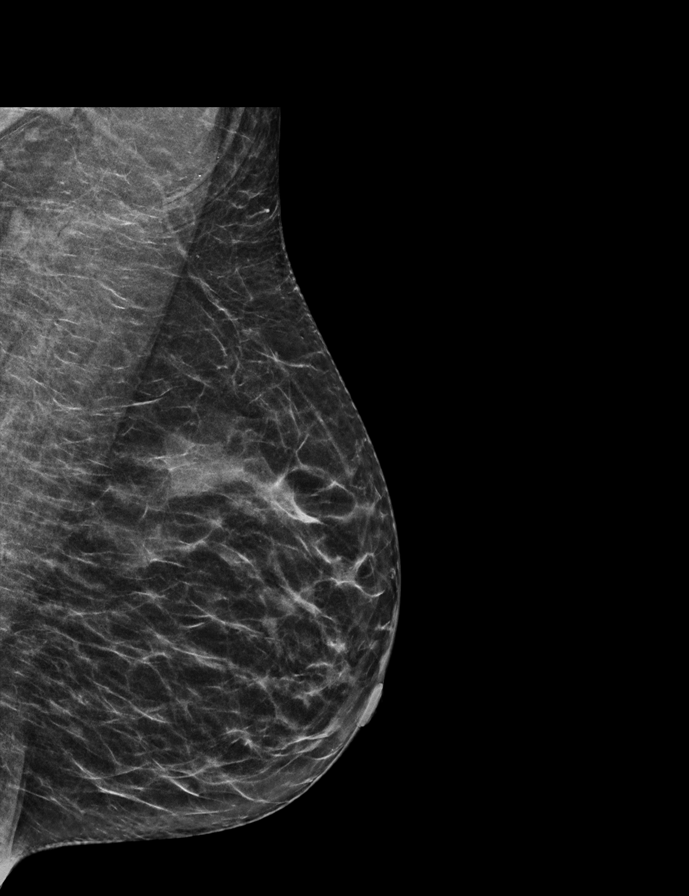

[R CC synth-2D]
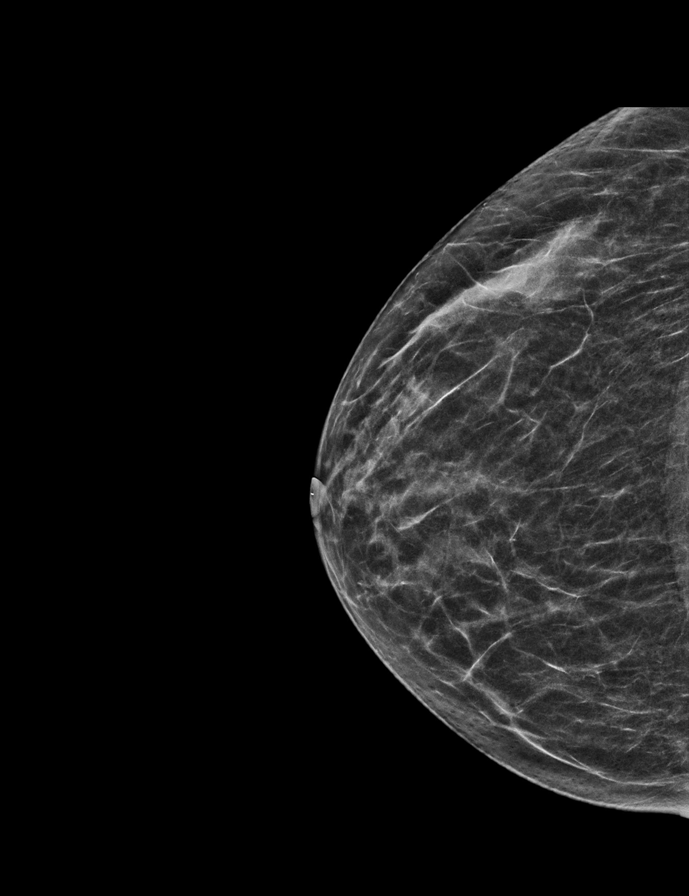

[L CC tomo · 2 of 44 frames shown]
[frame 15/44]
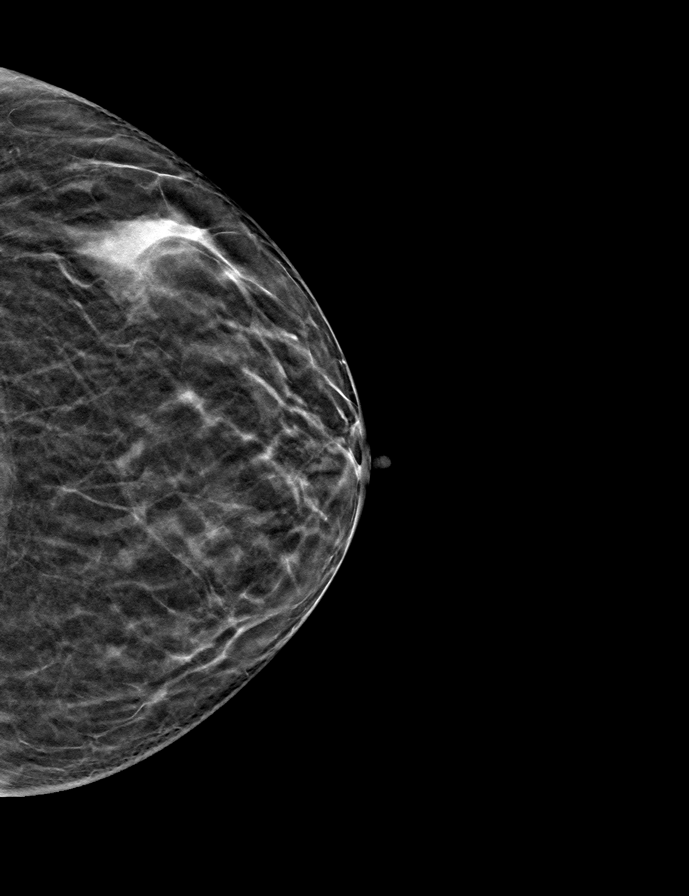
[frame 23/44]
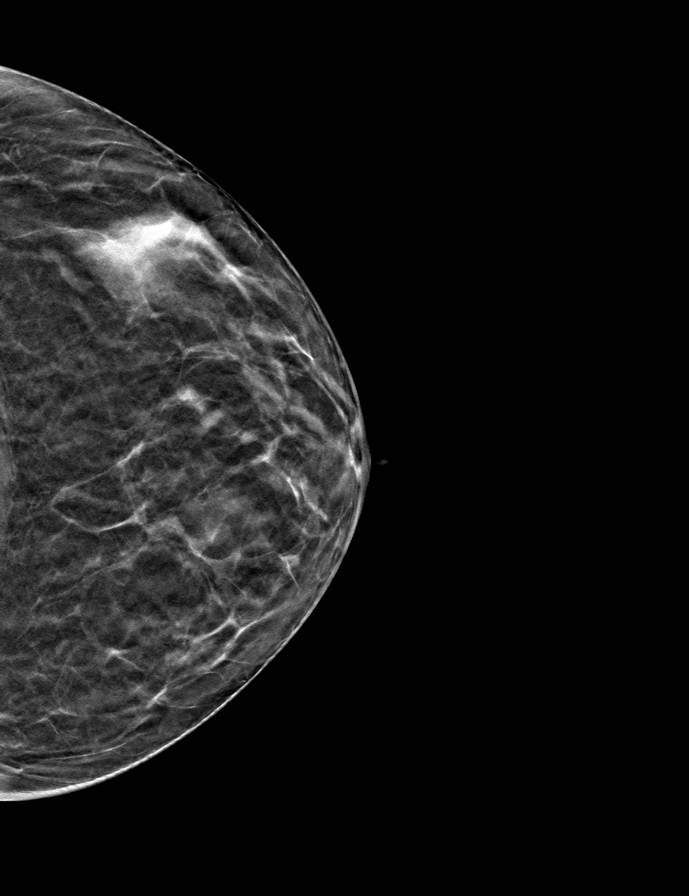

[L MLO tomo · tomo slice 24/47.0]
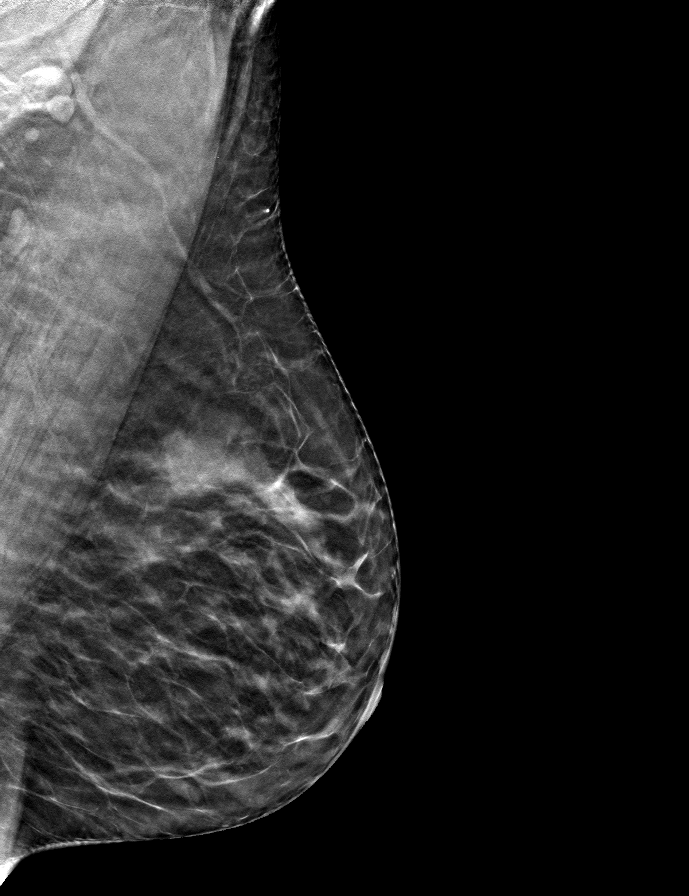

[R MLO tomo · tomo slice 24/47.0]
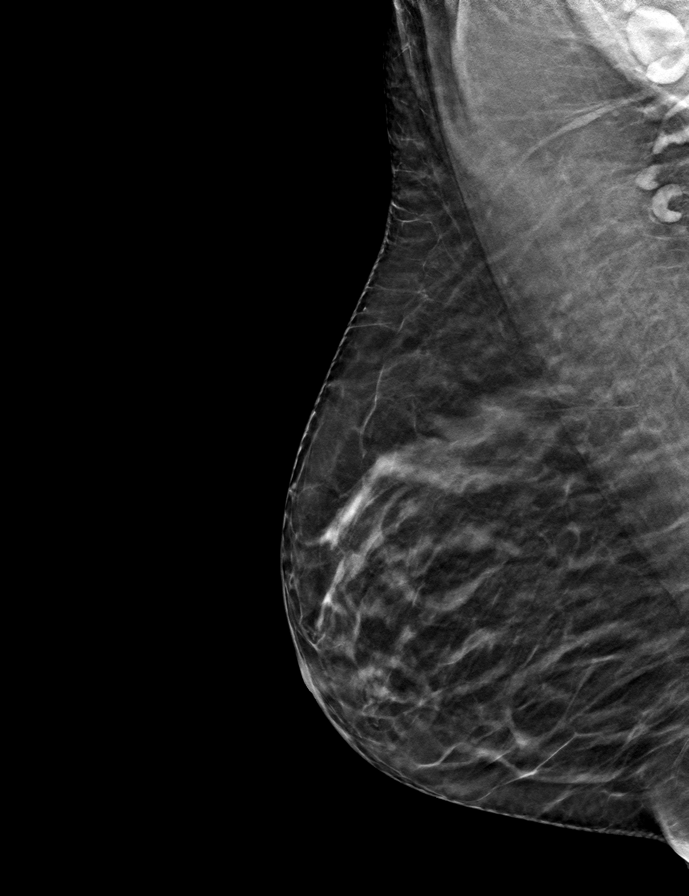

[R CC tomo · tomo slice 23/46.0]
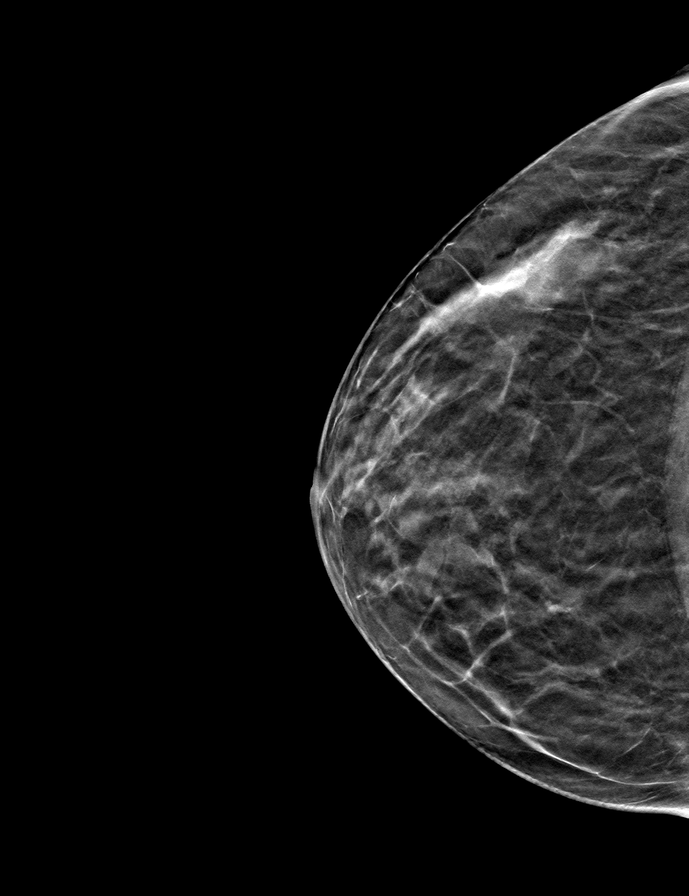

[9 of 24 positions shown; findings below may reference images not displayed]

ACR Breast Density Category b: There are scattered areas of
fibroglandular density.
FINDINGS: There are no findings suspicious for malignancy. Images were
processed with CAD.
IMPRESSION: No mammographic evidence of malignancy. A result letter of this
screening mammogram will be mailed directly to the patient.

RECOMMENDATION:
Screening mammogram in one year. (Code:[id number])

BI-RADS CATEGORY  1: Negative.

## 2021-10-17 DIAGNOSIS — H43812 Vitreous degeneration, left eye: Secondary | ICD-10-CM | POA: Diagnosis not present

## 2021-10-17 DIAGNOSIS — H52203 Unspecified astigmatism, bilateral: Secondary | ICD-10-CM | POA: Diagnosis not present

## 2021-10-17 DIAGNOSIS — H5213 Myopia, bilateral: Secondary | ICD-10-CM | POA: Diagnosis not present

## 2021-10-21 DIAGNOSIS — N3001 Acute cystitis with hematuria: Secondary | ICD-10-CM | POA: Diagnosis not present

## 2021-12-19 ENCOUNTER — Encounter: Payer: Self-pay | Admitting: Family Medicine

## 2021-12-19 ENCOUNTER — Ambulatory Visit (INDEPENDENT_AMBULATORY_CARE_PROVIDER_SITE_OTHER): Payer: BC Managed Care – PPO | Admitting: Family Medicine

## 2021-12-19 DIAGNOSIS — L710 Perioral dermatitis: Secondary | ICD-10-CM

## 2021-12-19 MED ORDER — TRIAMCINOLONE ACETONIDE 0.025 % EX OINT: 1.0000 "application " | TOPICAL_OINTMENT | Freq: Two times a day (BID) | CUTANEOUS | 1 refills | Status: AC

## 2021-12-19 NOTE — Assessment & Plan Note (Addendum)
Started in November and intially she started on Abreva OTC without great relief had some Triamcinolone 0.1% at home and that has helped the most. She did start a supplement of Bergamot a couple months before this started and she notes certain foods seem to flare it such as acidic foods. Avoid offending foods and she is sent for allergy testing given the persistence. Encouraged better hydration and given Triamcinolone 0.025 % ointment to apply in small amounts as needed once to twice daily. She has a dermatologist and will follow up with them if no improvement. Spent 15 minutes discussing history and forming plan of care.

## 2021-12-19 NOTE — Patient Instructions (Signed)
Perioral dermatitis  Perioral dermatitis is an eruption which is usually located around the mouth and nose.  It can be a rash and/or red bumps.  It occasionally occurs around the eyes.  It may be itchy and may burn.  The exact cause is unknown.  Some types of makeup, moisturizers, dental products, and prescription creams may be partially responsible for the eruption.  Topical steroids such as cortisone creams can temporarily make the rash better but with discontinuation the rash tends to recur and worsen.  If you have been using topical steroids, your dermatologist may need to gradually taper the strength of steroids.  Topical antibiotics, elidel cream, protopic ointment, and oral antiobiotics may be prescribed to treat this condition.  Although perioral dermatitis is not an infection, some antibiotics have anti-inflammatory properties that help it greatly.  

## 2021-12-19 NOTE — Progress Notes (Signed)
Subjective:    Patient ID: Krista Hampton, female    DOB: [birth year], 61 y.o.   MRN: [id number]  Chief Complaint  Patient presents with   rash around lips    HPI Patient is in today for evaluation of a rash around her mouth.  Symptoms started back in November.  She notes in July she had started some bergamot for her high cholesterol but did not experience the rash right away.  She cannot think of any other changes.  No new oral medications, toothpaste, facial creams, detergents etc.  She notes the redness and rash goes all the way around her upper and lower lip and can have swelling pain and itching associated with it.  She has tried multiple over-the-counter medications including Abreva with no benefit.  She found some triamcinolone at home and that has been the most helpful.  She does note certain foods flare such as acidic and spicy foods.  She denies any oral lesions or rash elsewhere.  Otherwise she reports she feels well. Denies CP/palp/SOB/HA/congestion/fevers/GI or GU c/o. 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   Stroke ([place]) 1986   mild after childbirth    Past Surgical History:  Procedure Laterality Date   ABDOMINAL HYSTERECTOMY  2002   partial- still has left ovary   birth mark removal Left 1967   forearm   right ovary  2005   removed   sarcoma removal Left 01/23/2014   TONSILLECTOMY  1979   WISDOM TOOTH EXTRACTION  1980    Family History  Problem Relation Age of Onset   Lupus Mother    Diabetes Mother        type 2   Hypertension Mother    Heart disease Father        open heart surgery   Hyperlipidemia Father    Heart  attack Father        several   Diabetes Sister        type 2   Heart disease Sister    Diabetes Sister        type 2   Hypertension Sister    Thyroid disease Sister    Kidney disease Sister    Heart disease Brother    Hypertension Brother    Diabetes Maternal Grandmother        type 2   Heart attack Paternal Grandfather 7       massive   Depression Paternal Grandfather    Asthma Paternal Grandfather    Colon cancer Neg Hx    Stomach cancer Neg Hx    Rectal cancer Neg Hx     Social History   Socioeconomic History   Marital status: Married    Spouse name: Not on file   Number of children: Not on file   Years of education: Not on file   Highest education level: Not on file  Occupational History   Not on file  Tobacco Use   Smoking status: Never   Smokeless tobacco: Never  Substance and Sexual Activity   Alcohol use:  Yes    Comment: 2 drinks a month   Drug use: No   Sexual activity: Yes  Other Topics Concern   Not on file  Social History Narrative   Not on file   Social Determinants of Health   Financial Resource Strain: Not on file  Food Insecurity: Not on file  Transportation Needs: Not on file  Physical Activity: Not on file  Stress: Not on file  Social Connections: Not on file  Intimate Partner Violence: Not on file    Outpatient Medications Prior to Visit  Medication Sig Dispense Refill   Biotin 10000 MCG TABS Take by mouth.     calcium-vitamin D (OSCAL WITH D) 500-200 MG-UNIT tablet Take 1 tablet by mouth.     cholecalciferol (VITAMIN D3) 25 MCG (1000 UNIT) tablet Take 1,000 Units by mouth daily. 500 IU     Krill Oil 500 MG CAPS Take by mouth.     Omega-3 Fatty Acids (EPA PO) Take by mouth.     Omega-3 Fatty Acids (OMEGA-3 FISH OIL PO) Take by mouth.     UNABLE TO FIND Med Name: [place] Citrus Berg[place] 500 mg     fexofenadine (ALLEGRA) 180 MG tablet Take 1 tablet (180 mg total) by mouth daily. 30 tablet 8   fluticasone (FLONASE) 50 MCG/ACT nasal  spray SHAKE LIQUID AND USE 2 SPRAYS IN EACH NOSTRIL DAILY 16 g 1   meclizine (ANTIVERT) 12.5 MG tablet Take 1 tablet (12.5 mg total) by mouth 3 (three) times daily as needed for dizziness. 30 tablet 0   Tart Cherry (TART CHERRY ULTRA) 1200 MG CAPS Take by mouth.     No facility-administered medications prior to visit.    Allergies  Allergen Reactions   Sulfa Antibiotics Hives   Clindamycin/Lincomycin Hives and Rash   Erythromycin Itching and Rash   Keflex [Cephalexin] Rash   Latex Rash   Penicillins Rash   Tetracyclines & Related Hives and Rash    Review of Systems  Constitutional:  Negative for fever and malaise/fatigue.  HENT:  Negative for congestion.   Eyes:  Negative for blurred vision.  Respiratory:  Negative for shortness of breath.   Cardiovascular:  Negative for chest pain, palpitations and leg swelling.  Gastrointestinal:  Negative for abdominal pain, blood in stool and nausea.  Genitourinary:  Negative for dysuria and frequency.  Musculoskeletal:  Negative for falls.  Skin:  Positive for itching and rash.  Neurological:  Negative for dizziness, loss of consciousness and headaches.  Endo/Heme/Allergies:  Negative for environmental allergies.  Psychiatric/Behavioral:  Negative for depression. The patient is not nervous/anxious.       Objective:    Physical Exam Constitutional:      General: She is not in acute distress.    Appearance: She is well-developed.  HENT:     Head: Normocephalic and atraumatic.  Eyes:     Conjunctiva/sclera: Conjunctivae normal.  Neck:     Thyroid: No thyromegaly.  Cardiovascular:     Rate and Rhythm: Normal rate and regular rhythm.     Heart sounds: Normal heart sounds. No murmur heard. Pulmonary:     Effort: Pulmonary effort is normal. No respiratory distress.     Breath sounds: Normal breath sounds.  Abdominal:     General: Bowel sounds are normal. There is no distension.     Palpations: Abdomen is soft. There is no mass.      Tenderness: There is no abdominal tenderness.  Musculoskeletal:     Cervical back:  Neck supple. No rigidity.  Lymphadenopathy:     Cervical: No cervical adenopathy.  Skin:    General: Skin is warm and dry.     Findings: Rash present.     Comments: Slight swelling and erythema around mouth top and bottom, no oral lesions or other rash  Neurological:     Mental Status: She is alert and oriented to person, place, and time.  Psychiatric:        Behavior: Behavior normal.    BP 110/64    Pulse 86    Temp 97.6 F (36.4 C)    Resp 16    Ht 5\' 5"  (1.651 m)    Wt 131 lb (59.4 kg)    SpO2 97%    BMI 21.80 kg/m  Wt Readings from Last 3 Encounters:  12/19/21 131 lb (59.4 kg)  06/28/21 128 lb (58.1 kg)  06/14/20 129 lb 6.4 oz (58.7 kg)    Diabetic Foot Exam - Simple   No data filed    Lab Results  Component Value Date   WBC 6.1 08/22/2013   HGB 13.3 08/22/2013   HCT 38.7 08/22/2013   PLT 317 08/22/2013   GLUCOSE 86 06/28/2021   CHOL 216 (H) 06/28/2021   TRIG 49.0 06/28/2021   HDL 91.70 06/28/2021   LDLCALC 115 (H) 06/28/2021   ALT 11 06/28/2021   AST 18 06/28/2021   NA 139 06/28/2021   K 4.5 06/28/2021   CL 102 06/28/2021   CREATININE 0.80 06/28/2021   BUN 17 06/28/2021   CO2 28 06/28/2021   TSH 1.66 06/28/2021   HGBA1C 5.6 06/02/2021    Lab Results  Component Value Date   TSH 1.66 06/28/2021   Lab Results  Component Value Date   WBC 6.1 08/22/2013   HGB 13.3 08/22/2013   HCT 38.7 08/22/2013   MCV 88.4 08/22/2013   PLT 317 08/22/2013   Lab Results  Component Value Date   NA 139 06/28/2021   K 4.5 06/28/2021   CO2 28 06/28/2021   GLUCOSE 86 06/28/2021   BUN 17 06/28/2021   CREATININE 0.80 06/28/2021   BILITOT 0.5 06/28/2021   ALKPHOS 67 06/28/2021   AST 18 06/28/2021   ALT 11 06/28/2021   PROT 7.0 06/28/2021   ALBUMIN 4.8 06/28/2021   CALCIUM 10.0 06/28/2021   GFR 80.50 06/28/2021   Lab Results  Component Value Date   CHOL 216 (H) 06/28/2021   Lab  Results  Component Value Date   HDL 91.70 06/28/2021   Lab Results  Component Value Date   LDLCALC 115 (H) 06/28/2021   Lab Results  Component Value Date   TRIG 49.0 06/28/2021   Lab Results  Component Value Date   CHOLHDL 2 06/28/2021   Lab Results  Component Value Date   HGBA1C 5.6 06/02/2021       Assessment & Plan:   Problem List Items Addressed This Visit     Perioral dermatitis    Started in November and intially she started on Abreva OTC without great relief had some Triamcinolone 0.1% at home and that has helped the most. She did start a supplement of Bergamot a couple months before this started and she notes certain foods seem to flare it such as acidic foods. Avoid offending foods and she is sent for allergy testing given the persistence. Encouraged better hydration and given Triamcinolone 0.025 % ointment to apply in small amounts as needed once to twice daily. She has a Paediatric nurse and will  follow up with them if no improvement. Spent 15 minutes discussing history and forming plan of care.       Relevant Orders   Ambulatory referral to Allergy    I have discontinued Bertina Goracke's fexofenadine, meclizine, fluticasone, and Tart Cherry Ultra. I am also having her start on triamcinolone. Additionally, I am having her maintain her cholecalciferol, calcium-vitamin D, Omega-3 Fatty Acids (OMEGA-3 FISH OIL PO), Krill Oil, Omega-3 Fatty Acids (EPA PO), Biotin, and UNABLE TO FIND.  Meds ordered this encounter  Medications   triamcinolone (KENALOG) 0.025 % ointment    Sig: Apply 1 application topically 2 (two) times daily.    Dispense:  80 g    Refill:  1     Penni Homans, MD

## 2021-12-30 ENCOUNTER — Ambulatory Visit: Payer: Self-pay | Admitting: Internal Medicine

## 2022-02-15 DIAGNOSIS — C499 Malignant neoplasm of connective and soft tissue, unspecified: Secondary | ICD-10-CM | POA: Diagnosis not present

## 2022-02-15 DIAGNOSIS — Z08 Encounter for follow-up examination after completed treatment for malignant neoplasm: Secondary | ICD-10-CM | POA: Diagnosis not present

## 2022-02-15 DIAGNOSIS — C4922 Malignant neoplasm of connective and soft tissue of left lower limb, including hip: Secondary | ICD-10-CM | POA: Diagnosis not present

## 2022-06-22 ENCOUNTER — Encounter: Payer: BC Managed Care – PPO | Admitting: Family Medicine
